# Patient Record
Sex: Female | Born: 1950 | Race: Black or African American | Hispanic: No | Marital: Single | Smoking: Never smoker
Health system: Southern US, Community
[De-identification: ages and names within clinical notes are randomized; demographics above are authoritative.]

## PROBLEM LIST (undated history)

## (undated) DIAGNOSIS — E119 Type 2 diabetes mellitus without complications: Secondary | ICD-10-CM

## (undated) DIAGNOSIS — N289 Disorder of kidney and ureter, unspecified: Secondary | ICD-10-CM

## (undated) DIAGNOSIS — I10 Essential (primary) hypertension: Secondary | ICD-10-CM

## (undated) HISTORY — PX: ABDOMINAL HYSTERECTOMY: SHX81

---

## 2004-12-15 ENCOUNTER — Emergency Department (HOSPITAL_COMMUNITY): Admission: EM | Admit: 2004-12-15 | Discharge: 2004-12-15 | Payer: Self-pay | Admitting: Emergency Medicine

## 2011-09-04 ENCOUNTER — Emergency Department (HOSPITAL_BASED_OUTPATIENT_CLINIC_OR_DEPARTMENT_OTHER): Payer: No Typology Code available for payment source

## 2011-09-04 ENCOUNTER — Emergency Department (HOSPITAL_BASED_OUTPATIENT_CLINIC_OR_DEPARTMENT_OTHER)
Admission: EM | Admit: 2011-09-04 | Discharge: 2011-09-04 | Disposition: A | Discharge status: Home / Self Care | Payer: No Typology Code available for payment source | Attending: Emergency Medicine | Admitting: Emergency Medicine

## 2011-09-04 DIAGNOSIS — M542 Cervicalgia: Secondary | ICD-10-CM | POA: Insufficient documentation

## 2011-09-04 DIAGNOSIS — Y92410 Unspecified street and highway as the place of occurrence of the external cause: Secondary | ICD-10-CM | POA: Insufficient documentation

## 2011-09-04 DIAGNOSIS — M25579 Pain in unspecified ankle and joints of unspecified foot: Secondary | ICD-10-CM | POA: Insufficient documentation

## 2011-09-04 DIAGNOSIS — S8253XA Displaced fracture of medial malleolus of unspecified tibia, initial encounter for closed fracture: Secondary | ICD-10-CM

## 2011-09-04 DIAGNOSIS — S161XXA Strain of muscle, fascia and tendon at neck level, initial encounter: Secondary | ICD-10-CM

## 2011-09-04 DIAGNOSIS — S139XXA Sprain of joints and ligaments of unspecified parts of neck, initial encounter: Secondary | ICD-10-CM | POA: Insufficient documentation

## 2011-09-04 MED ORDER — TRAMADOL HCL 50 MG PO TABS: 50.0000 mg | ORAL_TABLET | Freq: Four times a day (QID) | ORAL | Status: AC | PRN

## 2011-09-04 MED ORDER — TRAMADOL HCL 50 MG PO TABS
50.0000 mg | ORAL_TABLET | Freq: Once | ORAL | Status: AC
  Administered 2011-09-04: 50 mg via ORAL
  Filled 2011-09-04: qty 1
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
## 2011-09-04 NOTE — ED Notes (Signed)
Pt. Also c/o R ankle pain and has noted splint on t he R ankle.

## 2011-09-04 NOTE — ED Notes (Signed)
Pt. Is fully boarded with c-collar and c/o pain in her neck to t he R side.

## 2012-01-17 ENCOUNTER — Encounter (HOSPITAL_BASED_OUTPATIENT_CLINIC_OR_DEPARTMENT_OTHER): Payer: Self-pay | Admitting: *Deleted

## 2012-01-17 ENCOUNTER — Emergency Department (HOSPITAL_BASED_OUTPATIENT_CLINIC_OR_DEPARTMENT_OTHER)
Admission: EM | Admit: 2012-01-17 | Discharge: 2012-01-17 | Disposition: A | Discharge status: Home / Self Care | Payer: BC Managed Care – PPO | Attending: Emergency Medicine | Admitting: Emergency Medicine

## 2012-01-17 ENCOUNTER — Emergency Department (HOSPITAL_BASED_OUTPATIENT_CLINIC_OR_DEPARTMENT_OTHER): Payer: BC Managed Care – PPO

## 2012-01-17 DIAGNOSIS — W010XXA Fall on same level from slipping, tripping and stumbling without subsequent striking against object, initial encounter: Secondary | ICD-10-CM | POA: Insufficient documentation

## 2012-01-17 DIAGNOSIS — Z79899 Other long term (current) drug therapy: Secondary | ICD-10-CM | POA: Insufficient documentation

## 2012-01-17 DIAGNOSIS — S060XAA Concussion with loss of consciousness status unknown, initial encounter: Secondary | ICD-10-CM | POA: Insufficient documentation

## 2012-01-17 DIAGNOSIS — Y9289 Other specified places as the place of occurrence of the external cause: Secondary | ICD-10-CM | POA: Insufficient documentation

## 2012-01-17 DIAGNOSIS — Y9389 Activity, other specified: Secondary | ICD-10-CM | POA: Insufficient documentation

## 2012-01-17 DIAGNOSIS — S060X9A Concussion with loss of consciousness of unspecified duration, initial encounter: Secondary | ICD-10-CM

## 2012-01-17 DIAGNOSIS — I10 Essential (primary) hypertension: Secondary | ICD-10-CM | POA: Insufficient documentation

## 2012-01-17 HISTORY — DX: Essential (primary) hypertension: I10
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
## 2012-01-17 NOTE — ED Notes (Signed)
Patient transported to CT 
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
## 2014-11-15 DIAGNOSIS — E785 Hyperlipidemia, unspecified: Secondary | ICD-10-CM | POA: Insufficient documentation

## 2016-03-12 DIAGNOSIS — Z9119 Patient's noncompliance with other medical treatment and regimen: Secondary | ICD-10-CM | POA: Insufficient documentation

## 2016-03-12 DIAGNOSIS — R7301 Impaired fasting glucose: Secondary | ICD-10-CM | POA: Insufficient documentation

## 2016-03-12 DIAGNOSIS — Z91199 Patient's noncompliance with other medical treatment and regimen due to unspecified reason: Secondary | ICD-10-CM | POA: Insufficient documentation

## 2016-04-07 DIAGNOSIS — Z1239 Encounter for other screening for malignant neoplasm of breast: Secondary | ICD-10-CM | POA: Insufficient documentation

## 2017-02-17 ENCOUNTER — Ambulatory Visit (INDEPENDENT_AMBULATORY_CARE_PROVIDER_SITE_OTHER): Payer: Self-pay | Admitting: Family

## 2017-02-19 ENCOUNTER — Ambulatory Visit (INDEPENDENT_AMBULATORY_CARE_PROVIDER_SITE_OTHER): Payer: Self-pay | Admitting: Physician Assistant

## 2017-02-19 ENCOUNTER — Ambulatory Visit (INDEPENDENT_AMBULATORY_CARE_PROVIDER_SITE_OTHER): Payer: Self-pay | Admitting: Family

## 2017-02-19 ENCOUNTER — Encounter (INDEPENDENT_AMBULATORY_CARE_PROVIDER_SITE_OTHER): Payer: Self-pay

## 2017-06-01 DIAGNOSIS — E079 Disorder of thyroid, unspecified: Secondary | ICD-10-CM | POA: Insufficient documentation

## 2017-06-26 ENCOUNTER — Emergency Department (HOSPITAL_BASED_OUTPATIENT_CLINIC_OR_DEPARTMENT_OTHER): Payer: Medicare HMO

## 2017-06-26 ENCOUNTER — Other Ambulatory Visit: Payer: Self-pay

## 2017-06-26 ENCOUNTER — Encounter (HOSPITAL_BASED_OUTPATIENT_CLINIC_OR_DEPARTMENT_OTHER): Payer: Self-pay | Admitting: Emergency Medicine

## 2017-06-26 ENCOUNTER — Observation Stay (HOSPITAL_BASED_OUTPATIENT_CLINIC_OR_DEPARTMENT_OTHER)
Admission: EM | Admit: 2017-06-26 | Discharge: 2017-06-28 | Disposition: A | Discharge status: Home / Self Care | Payer: Medicare HMO | Attending: Internal Medicine | Admitting: Internal Medicine

## 2017-06-26 DIAGNOSIS — E119 Type 2 diabetes mellitus without complications: Secondary | ICD-10-CM

## 2017-06-26 DIAGNOSIS — Z7984 Long term (current) use of oral hypoglycemic drugs: Secondary | ICD-10-CM | POA: Diagnosis not present

## 2017-06-26 DIAGNOSIS — R55 Syncope and collapse: Secondary | ICD-10-CM | POA: Diagnosis present

## 2017-06-26 DIAGNOSIS — I2699 Other pulmonary embolism without acute cor pulmonale: Principal | ICD-10-CM | POA: Diagnosis present

## 2017-06-26 DIAGNOSIS — R42 Dizziness and giddiness: Secondary | ICD-10-CM | POA: Insufficient documentation

## 2017-06-26 DIAGNOSIS — R059 Cough, unspecified: Secondary | ICD-10-CM

## 2017-06-26 DIAGNOSIS — Z8249 Family history of ischemic heart disease and other diseases of the circulatory system: Secondary | ICD-10-CM | POA: Diagnosis not present

## 2017-06-26 DIAGNOSIS — Z79899 Other long term (current) drug therapy: Secondary | ICD-10-CM | POA: Diagnosis not present

## 2017-06-26 DIAGNOSIS — N289 Disorder of kidney and ureter, unspecified: Secondary | ICD-10-CM

## 2017-06-26 DIAGNOSIS — R05 Cough: Secondary | ICD-10-CM | POA: Insufficient documentation

## 2017-06-26 DIAGNOSIS — N179 Acute kidney failure, unspecified: Secondary | ICD-10-CM | POA: Insufficient documentation

## 2017-06-26 DIAGNOSIS — E876 Hypokalemia: Secondary | ICD-10-CM | POA: Diagnosis not present

## 2017-06-26 DIAGNOSIS — I951 Orthostatic hypotension: Secondary | ICD-10-CM | POA: Diagnosis present

## 2017-06-26 DIAGNOSIS — R0602 Shortness of breath: Secondary | ICD-10-CM | POA: Diagnosis not present

## 2017-06-26 DIAGNOSIS — I10 Essential (primary) hypertension: Secondary | ICD-10-CM | POA: Diagnosis present

## 2017-06-26 HISTORY — DX: Type 2 diabetes mellitus without complications: E11.9

## 2017-06-26 HISTORY — DX: Disorder of kidney and ureter, unspecified: N28.9

## 2017-06-26 LAB — BASIC METABOLIC PANEL
Anion gap: 13 (ref 5–15)
BUN: 15 mg/dL (ref 6–20)
CO2: 31 mmol/L (ref 22–32)
Calcium: 9.1 mg/dL (ref 8.9–10.3)
Chloride: 98 mmol/L — ABNORMAL LOW (ref 101–111)
Creatinine, Ser: 1.58 mg/dL — ABNORMAL HIGH (ref 0.44–1.00)
GFR calc Af Amer: 38 mL/min — ABNORMAL LOW (ref >60)
GFR calc non Af Amer: 33 mL/min — ABNORMAL LOW (ref >60)
Glucose, Bld: 182 mg/dL — ABNORMAL HIGH (ref 65–99)
Potassium: 3.4 mmol/L — ABNORMAL LOW (ref 3.5–5.1)
Sodium: 142 mmol/L (ref 135–145)

## 2017-06-26 LAB — CBC
HCT: 35.9 % — ABNORMAL LOW (ref 36.0–46.0)
Hemoglobin: 12.3 g/dL (ref 12.0–15.0)
MCH: 30.9 pg (ref 26.0–34.0)
MCHC: 34.3 g/dL (ref 30.0–36.0)
MCV: 90.2 fL (ref 78.0–100.0)
Platelets: 294 10*3/uL (ref 150–400)
RBC: 3.98 MIL/uL (ref 3.87–5.11)
RDW: 12.5 % (ref 11.5–15.5)
WBC: 5.1 10*3/uL (ref 4.0–10.5)

## 2017-06-26 LAB — TROPONIN I: Troponin I: <0.03 ng/mL (ref <0.03)

## 2017-06-26 LAB — BRAIN NATRIURETIC PEPTIDE: B Natriuretic Peptide: 37.5 pg/mL (ref 0.0–100.0)

## 2017-06-26 LAB — CREATININE, URINE, RANDOM: Creatinine, Urine: 66.61 mg/dL

## 2017-06-26 LAB — GLUCOSE, CAPILLARY: Glucose-Capillary: 119 mg/dL — ABNORMAL HIGH (ref 65–99)

## 2017-06-26 LAB — D-DIMER, QUANTITATIVE: D-Dimer, Quant: 1.2 ug/mL-FEU — ABNORMAL HIGH (ref 0.00–0.50)

## 2017-06-26 LAB — SODIUM, URINE, RANDOM: Sodium, Ur: 97 mmol/L

## 2017-06-26 MED ORDER — IOPAMIDOL (ISOVUE-370) INJECTION 76%
100.0000 mL | Freq: Once | INTRAVENOUS | Status: AC | PRN
  Administered 2017-06-26: 69 mL via INTRAVENOUS

## 2017-06-26 MED ORDER — POTASSIUM CHLORIDE CRYS ER 20 MEQ PO TBCR
20.0000 meq | EXTENDED_RELEASE_TABLET | Freq: Once | ORAL | Status: AC
  Administered 2017-06-26: 20 meq via ORAL
  Filled 2017-06-26: qty 1

## 2017-06-26 MED ORDER — SIMVASTATIN 20 MG PO TABS
20.0000 mg | ORAL_TABLET | Freq: Every day | ORAL | Status: DC
  Administered 2017-06-27: 20 mg via ORAL
  Filled 2017-06-26: qty 1

## 2017-06-26 MED ORDER — RIVAROXABAN 15 MG PO TABS
15.0000 mg | ORAL_TABLET | Freq: Two times a day (BID) | ORAL | Status: DC
  Administered 2017-06-26 – 2017-06-28 (×4): 15 mg via ORAL
  Filled 2017-06-26 (×4): qty 1

## 2017-06-26 MED ORDER — GUAIFENESIN-DM 100-10 MG/5ML PO SYRP
5.0000 mL | ORAL_SOLUTION | ORAL | Status: DC | PRN
  Administered 2017-06-26 – 2017-06-28 (×5): 5 mL via ORAL
  Filled 2017-06-26 (×5): qty 5

## 2017-06-26 MED ORDER — HYDRALAZINE HCL 20 MG/ML IJ SOLN
10.0000 mg | INTRAMUSCULAR | Status: DC | PRN
  Filled 2017-06-26: qty 1

## 2017-06-26 MED ORDER — ACETAMINOPHEN 650 MG RE SUPP: 650.0000 mg | Freq: Four times a day (QID) | RECTAL | Status: DC | PRN

## 2017-06-26 MED ORDER — SENNOSIDES-DOCUSATE SODIUM 8.6-50 MG PO TABS: 1.0000 | ORAL_TABLET | Freq: Every evening | ORAL | Status: DC | PRN

## 2017-06-26 MED ORDER — SODIUM CHLORIDE 0.9 % IV BOLUS
500.0000 mL | Freq: Once | INTRAVENOUS | Status: AC
  Administered 2017-06-26: 500 mL via INTRAVENOUS

## 2017-06-26 MED ORDER — POTASSIUM CHLORIDE IN NACL 20-0.9 MEQ/L-% IV SOLN
INTRAVENOUS | Status: AC
  Administered 2017-06-26 – 2017-06-27 (×2): via INTRAVENOUS
  Filled 2017-06-26 (×2): qty 1000

## 2017-06-26 MED ORDER — INSULIN ASPART 100 UNIT/ML ~~LOC~~ SOLN
0.0000 [IU] | Freq: Three times a day (TID) | SUBCUTANEOUS | Status: DC
  Administered 2017-06-27 – 2017-06-28 (×2): 1 [IU] via SUBCUTANEOUS

## 2017-06-26 MED ORDER — ONDANSETRON HCL 4 MG PO TABS: 4.0000 mg | ORAL_TABLET | Freq: Four times a day (QID) | ORAL | Status: DC | PRN

## 2017-06-26 MED ORDER — RIVAROXABAN 15 MG PO TABS
15.0000 mg | ORAL_TABLET | Freq: Once | ORAL | Status: AC
  Administered 2017-06-26: 15 mg via ORAL
  Filled 2017-06-26: qty 1

## 2017-06-26 MED ORDER — ASPIRIN 81 MG PO CHEW
324.0000 mg | CHEWABLE_TABLET | Freq: Once | ORAL | Status: AC
  Administered 2017-06-26: 324 mg via ORAL
  Filled 2017-06-26: qty 4

## 2017-06-26 MED ORDER — ACETAMINOPHEN 325 MG PO TABS: 650.0000 mg | ORAL_TABLET | Freq: Four times a day (QID) | ORAL | Status: DC | PRN

## 2017-06-26 MED ORDER — HYDROCODONE-ACETAMINOPHEN 5-325 MG PO TABS: 1.0000 | ORAL_TABLET | ORAL | Status: DC | PRN

## 2017-06-26 MED ORDER — ONDANSETRON HCL 4 MG/2ML IJ SOLN: 4.0000 mg | Freq: Four times a day (QID) | INTRAMUSCULAR | Status: DC | PRN

## 2017-06-26 MED ORDER — INSULIN ASPART 100 UNIT/ML ~~LOC~~ SOLN: 0.0000 [IU] | Freq: Every day | SUBCUTANEOUS | Status: DC

## 2017-06-26 NOTE — ED Provider Notes (Signed)
[facility] EMERGENCY DEPARTMENT Provider Note   CSN: [id number] Arrival date & time: 06/26/17  1228     History   Chief Complaint Chief Complaint  Patient presents with  . Near Syncope    HPI Ashlee Riddle is a 67 y.o. female.  HPI Patient presents to the emergency room after having a near syncopal episode this morning.  Patient states for the last week or so she has had trouble with a cough.  It has increased over the last couple of days.  Patient is frequently coughing and she feels like there is some fluid on her chest.  She has had episodes of posttussive emesis.  She does feel lightheaded when she is coughing.  This morning she had a near syncopal episode where she felt very lightheaded and thought she was going to pass out.  She immediately went to lie down and her symptoms slowly resolved.  She then came to the emergency room for evaluation.  She denies any chest pain.  No leg swelling.  No fevers.  No abdominal pain.  She denies any history of heart or lung disease. Past Medical History:  Diagnosis Date  . Diabetes mellitus without complication (HCC)   . Hypertension     There are no active problems to display for this patient.   Past Surgical History:  Procedure Laterality Date  . ABDOMINAL HYSTERECTOMY       OB History   None      Home Medications    Prior to Admission medications   Medication Sig Start Date End Date Taking? Authorizing Provider  metFORMIN (GLUCOPHAGE) 500 MG tablet Take 500 mg by mouth 2 (two) times daily with a meal.   Yes [provider]  labetalol (NORMODYNE) 200 MG tablet Take 200 mg by mouth 2 (two) times daily.    [provider]  simvastatin (ZOCOR) 20 MG tablet Take 20 mg by mouth every evening.    [provider]  triamterene-hydrochlorothiazide (DYAZIDE) 37.5-25 MG per capsule Take 1 capsule by mouth every morning.    [provider]    Family History History reviewed. No pertinent  family history.  Social History Social History   Tobacco Use  . Smoking status: Never Smoker  . Smokeless tobacco: Never Used  Substance Use Topics  . Alcohol use: No  . Drug use: No     Allergies   Patient has no known allergies.   Review of Systems Review of Systems  All other systems reviewed and are negative.    Physical Exam Updated Vital Signs BP 130/83   Pulse 79   Temp 98.2 F (36.8 C) (Oral)   Resp (!) 21   Ht 1.575 m (5\' 2" )   Wt 82.1 kg (181 lb)   SpO2 99%   BMI 33.11 kg/m   Physical Exam  Constitutional: She appears well-developed and well-nourished. No distress.  HENT:  Head: Normocephalic and atraumatic.  Right Ear: External ear normal.  Left Ear: External ear normal.  Eyes: Conjunctivae are normal. Right eye exhibits no discharge. Left eye exhibits no discharge. No scleral icterus.  Neck: Neck supple. No tracheal deviation present.  Cardiovascular: Normal rate, regular rhythm and intact distal pulses.  Pulmonary/Chest: Effort normal and breath sounds normal. No stridor. No respiratory distress. She has no wheezes. She has no rales.  Abdominal: Soft. Bowel sounds are normal. She exhibits no distension. There is no tenderness. There is no rebound and no guarding.  Musculoskeletal: She exhibits no edema  or tenderness.  Neurological: She is alert. She has normal strength. No cranial nerve deficit (no facial droop, extraocular movements intact, no slurred speech) or sensory deficit. She exhibits normal muscle tone. She displays no seizure activity. Coordination normal.  Skin: Skin is warm and dry. No rash noted.  Psychiatric: She has a normal mood and affect.  Nursing note and vitals reviewed.    ED Treatments / Results  Labs (all labs ordered are listed, but only abnormal results are displayed) Labs Reviewed  BASIC METABOLIC PANEL - Abnormal; Notable for the following components:      Result Value   Potassium 3.4 (*)    Chloride 98 (*)     Glucose, Bld 182 (*)    Creatinine, Ser 1.58 (*)    GFR calc non Af Amer 33 (*)    GFR calc Af Amer 38 (*)    All other components within normal limits  CBC - Abnormal; Notable for the following components:   HCT 35.9 (*)    All other components within normal limits  D-DIMER, QUANTITATIVE (NOT AT [facility]) - Abnormal; Notable for the following components:   D-Dimer, Quant 1.20 (*)    All other components within normal limits  TROPONIN I  BRAIN NATRIURETIC PEPTIDE    EKG EKG Interpretation  Date/Time:  Saturday June 26 2017 12:47:13 EDT Ventricular Rate:  89 PR Interval:    QRS Duration: 90 QT Interval:  390 QTC Calculation: 475 R Axis:   19 Text Interpretation:  Sinus rhythm Low voltage, precordial leads No old tracing to compare Confirmed by Linwood Dibbles [phone]) on 06/26/2017 1:06:37 PM   Radiology Dg Chest 2 View  Result Date: 06/26/2017 CLINICAL DATA:  Cough, shortness breath, and chest discomfort for 2 weeks. EXAM: CHEST - 2 VIEW COMPARISON:  09/04/2011 FINDINGS: The heart size and mediastinal contours are within normal limits. Both lungs are clear. The visualized skeletal structures are unremarkable. IMPRESSION: No active cardiopulmonary disease. Electronically Signed   By: Myles Rosenthal M.D.   On: 06/26/2017 13:38    Procedures Procedures (including critical care time)  Medications Ordered in ED Medications  aspirin chewable tablet 324 mg (324 mg Oral Given 06/26/17 1322)  sodium chloride 0.9 % bolus 500 mL (0 mLs Intravenous Stopped 06/26/17 1440)  iopamidol (ISOVUE-370) 76 % injection 100 mL (69 mLs Intravenous Contrast Given 06/26/17 1440)     Initial Impression / Assessment and Plan / ED Course  I have reviewed the triage vital signs and the nursing notes.  Pertinent labs & imaging results that were available during my care of the patient were reviewed by me and considered in my medical decision making (see chart for details).  Clinical Course as of Jun 27 1515  Sat Jun 26, 2017  1431 D Dimer elevated.  Cr elevated.  Will proceed per radiology protocol to make sure she can get contrast.  CT chest ordered   [JK]    Clinical Course User Index [JK] Linwood Dibbles, MD    Pt presented to the ED with a cough and near syncopal episode.  No complaints of chest pain or shortness of breath.  Labs notable for elevated d dimer.  Mild orthostatic vitals signs.  IV fluids ordered.  Overall, doubt ACS, or cardiac dysrhythmia.  CT angio pending.  If negative suspect uri and vasovagal etiology.  Dr Jacqulyn Bath will follow up on CT scan  Final Clinical Impressions(s) / ED Diagnoses   Final diagnoses:  Near syncope  Cough  ED Discharge Orders    None       Linwood Dibbles, MD 06/26/17 1517

## 2017-06-26 NOTE — Progress Notes (Signed)
ANTICOAGULATION CONSULT NOTE - Initial Consult  Pharmacy Consult for Xarelto Indication: pulmonary embolus  No Known Allergies  Patient Measurements: Height: 5\' 2"  (157.5 cm) Weight: 181 lb (82.1 kg) IBW/kg (Calculated) : 50.1  Vital Signs: Temp: 98.8 F (37.1 C) (06/08 2043) Temp Source: Oral (06/08 1233) BP: 173/91 (06/08 2043) Pulse Rate: 78 (06/08 2043)  Labs: Recent Labs    06/26/17 1319  HGB 12.3  HCT 35.9*  PLT 294  CREATININE 1.58*  TROPONINI <0.03    Estimated Creatinine Clearance: 33.8 mL/min (A) (by C-G formula based on SCr of 1.58 mg/dL (H)).   Medical History: Past Medical History:  Diagnosis Date  . Diabetes mellitus without complication (HCC)   . Hypertension   . Renal insufficiency 06/26/2017    Medications:  Scheduled:  . insulin aspart  0-5 Units Subcutaneous QHS  . [START ON 06/27/2017] insulin aspart  0-9 Units Subcutaneous TID WC  . potassium chloride  20 mEq Oral Once  . Rivaroxaban  15 mg Oral BID WC  . [START ON 06/27/2017] simvastatin  20 mg Oral q1800    Assessment: 67 yof presenting with near syncopal episode. CTA showing acute subsegmental PE bilaterally in lower lobes (no R heart strain). No anticoagulation PTA (of note, [facility]   Hgb 12.3, plt 294. D-dimer 1.2. Scr 1.58 (CrCl using TBW 44 mL/min).   Goal of Therapy:  Monitor platelets by anticoagulation protocol: Yes   Plan:  Order Xarelto 15 mg twice daily for 21 days then 20 mg daily Monitor CBC, renal fx, and for s/sx of bleeding  Girard CooterKimberly Perkins, PharmD Clinical Pharmacist  Pager: [phone] Phone: [phone] 06/26/2017,10:09 PM

## 2017-06-26 NOTE — ED Provider Notes (Signed)
Blood pressure 130/83, pulse 79, temperature 98.2 F (36.8 C), temperature source Oral, resp. rate 18, height 5\' 2"  (1.575 m), weight 82.1 kg (181 lb), SpO2 99 %.  Assuming care from Dr. Lynelle DoctorKnapp.  In short, Ashlee Riddle is a 67 y.o. female with a chief complaint of Near Syncope .  Refer to the original H&P for additional details.  The current plan of care is to follow up CTA and reassess.  03:30 PM Called by radiologist to discuss CT findings.  Patient has a subsegmental PE with no evidence of right heart strain on CT. patient has normal vital signs.  I am concerned about her near syncope events.  I evaluated the patient and discussed the results.  She states that not only did she have an episode of near syncope today but also had one on Wednesday that was not associated with coughing.  She has had swelling in both legs and chronic right knee pain.  No history of gastrointestinal bleeding, ICH, or known clotting disorder.  We discussed the risks and benefits of anticoagulation.  I will start the patient on Xarelto and admit for ECHO given the multiple episodes of near syncope and PE on CT. Patient would [facility].    EKG Interpretation  Date/Time:  Saturday June 26 2017 12:47:13 EDT Ventricular Rate:  89 PR Interval:    QRS Duration: 90 QT Interval:  390 QTC Calculation: 475 R Axis:   19 Text Interpretation:  Sinus rhythm Low voltage, precordial leads No old tracing to compare Confirmed by Linwood DibblesKnapp, Jon [phone]([id number]) on 06/26/2017 1:06:37 PM      04:03 PM [facility] does not have available telemetry beds. [facility]   Discussed patient's case with Hospitalist to request admission. Patient and family (if present) updated with plan. Care transferred to Hospitalist service.  I reviewed all nursing notes, vitals, pertinent old records, EKGs, labs, imaging (as available).  Alona BeneJoshua Long, MD Emergency Medicine      Long, Arlyss RepressJoshua G,  MD 06/26/17 [phone]

## 2017-06-26 NOTE — Discharge Instructions (Signed)
Information on my medicine - XARELTO (rivaroxaban)  This medication education was reviewed with me or my healthcare representative as part of my discharge preparation.   WHY WAS XARELTO PRESCRIBED FOR YOU? Xarelto was prescribed to treat blood clots that may have been found in the veins of your legs (deep vein thrombosis) or in your lungs (pulmonary embolism) and to reduce the risk of them occurring again.  What do you need to know about Xarelto? The starting dose is one 15 mg tablet taken TWICE daily with food for the FIRST 21 DAYS then on 6/29 the dose is changed to one 20 mg tablet taken ONCE A DAY with your evening meal.  DO NOT stop taking Xarelto without talking to the health care provider who prescribed the medication.  Refill your prescription for 20 mg tablets before you run out.  After discharge, you should have regular check-up appointments with your healthcare provider that is prescribing your Xarelto.  In the future your dose may need to be changed if your kidney function changes by a significant amount.  What do you do if you miss a dose? If you are taking Xarelto TWICE DAILY and you miss a dose, take it as soon as you remember. You may take two 15 mg tablets (total 30 mg) at the same time then resume your regularly scheduled 15 mg twice daily the next day.  If you are taking Xarelto ONCE DAILY and you miss a dose, take it as soon as you remember on the same day then continue your regularly scheduled once daily regimen the next day. Do not take two doses of Xarelto at the same time.   Important Safety Information Xarelto is a blood thinner medicine that can cause bleeding. You should call your healthcare provider right away if you experience any of the following: ? Bleeding from an injury or your nose that does not stop. ? Unusual colored urine (red or dark brown) or unusual colored stools (red or black). ? Unusual bruising for unknown reasons. ? A serious fall or if  you hit your head (even if there is no bleeding).  Some medicines may interact with Xarelto and might increase your risk of bleeding while on Xarelto. To help avoid this, consult your healthcare provider or pharmacist prior to using any new prescription or non-prescription medications, including herbals, vitamins, non-steroidal anti-inflammatory drugs (NSAIDs) and supplements.  This website has more information on Xarelto: [email or web address].xarelto.com.

## 2017-06-26 NOTE — ED Triage Notes (Addendum)
Patient states that she has felt lightheaded and dizzy x 2 days - she reports that she has had a cough x 2 days and feels like there is fluid on her chest. Patient states that she almost passed out this am when she coughed so hard
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
## 2017-06-26 NOTE — H&P (Signed)
History and Physical    Ashlee Riddle ZOX:[id number] DOB: [birth year] DOA: 06/26/2017  PCP: Maye Hides, PA   Patient coming from: Home, by way of [facility] ED   Chief Complaint: SOB, near-syncope x2   HPI: Ashlee Riddle is a 67 y.o. female with medical history significant for type 2 diabetes mellitus and hypertension, now presenting to the emergency department for evaluation of lightheadedness upon standing, cough, shortness of breath, and near syncope 3 days ago and again today.  Patient reports that she had been in her usual state of health until the recent insidious development of shortness of breath.  She has an lightheaded upon standing for the past 3 days, had a near syncopal episode 3 days ago, and again this morning.  She reports a nonproductive cough and mild chest discomfort.  She denies fevers, chills, any change in her chronic mild bilateral lower extremity edema, and denies lower extremity tenderness.  She denies any personal or family history of VTE.  She denies any recent long distance travel, had FNA of thyroid recently, but no surgery.  Does not take estrogen or smoke.  ED Course: Upon arrival to the ED, patient is found to be afebrile, saturating adequately on room air, slightly tachypneic, and mildly hypertensive.  EKG features a sinus rhythm and chest x-ray is negative for acute cardiopulmonary disease.  Chemistry panel is notable for slight hyponatremia, glucose 182, and creatinine 1.58, up from 1.04 a year ago.  CBC is unremarkable, troponin is undetectable, and BNP is normal.  D-dimer is elevated to 1.20 and CTA chest reveals acute subsegmental PE in the bilateral lower lobes.  She was found to have orthostatic hypotension in the ED.  She was treated with 324 mg of aspirin, 500 cc normal saline, and Xarelto.  She will be observed on the telemetry unit for ongoing evaluation and management.  Review of Systems:  All other systems reviewed and apart from HPI, are negative.  Past  Medical History:  Diagnosis Date  . Diabetes mellitus without complication (HCC)   . Hypertension   . Renal insufficiency 06/26/2017    Past Surgical History:  Procedure Laterality Date  . ABDOMINAL HYSTERECTOMY       reports that she has never smoked. She has never used smokeless tobacco. She reports that she does not drink alcohol or use drugs.  No Known Allergies  Family History  Problem Relation Age of Onset  . CVA Mother   . CVA Father   . CVA Sister      Prior to Admission medications   Medication Sig Start Date End Date Taking? Authorizing Provider  metFORMIN (GLUCOPHAGE) 500 MG tablet Take 500 mg by mouth 2 (two) times daily with a meal.   Yes [provider]  labetalol (NORMODYNE) 200 MG tablet Take 200 mg by mouth 2 (two) times daily.    [provider]  simvastatin (ZOCOR) 20 MG tablet Take 20 mg by mouth every evening.    [provider]  triamterene-hydrochlorothiazide (DYAZIDE) 37.5-25 MG per capsule Take 1 capsule by mouth every morning.    [provider]    Physical Exam: Vitals:   06/26/17 1830 06/26/17 1900 06/26/17 1930 06/26/17 2043  BP: 140/83 138/84 (!) 149/84 (!) 173/91  Pulse: 74 74 74 78  Resp: 15  11   Temp:    98.8 F (37.1 C)  TempSrc:      SpO2: 97% 99% 98% 100%  Weight:      Height:  Constitutional: NAD, calm  Eyes: PERTLA, lids and conjunctivae normal ENMT: Mucous membranes are moist. Posterior pharynx clear of any exudate or lesions.   Neck: normal, supple, no masses, no thyromegaly Respiratory: clear to auscultation bilaterally, no wheezing, no crackles. Normal respiratory effort.  Cardiovascular: S1 & S2 heard, regular rate and rhythm. Trace pretibial edema bilaterally. No significant JVD. Abdomen: No distension, no tenderness, soft. Bowel sounds normal.  Musculoskeletal: no clubbing / cyanosis. No joint deformity upper and lower extremities.   Skin: no significant rashes, lesions,  ulcers. Warm, dry, well-perfused. Neurologic: CN 2-12 grossly intact. Sensation intact. Strength 5/5 in all 4 limbs.  Psychiatric: Alert and oriented x 3. Pleasant and cooperative.     Labs on Admission: I have personally reviewed following labs and imaging studies  CBC: Recent Labs  Lab 06/26/17 1319  WBC 5.1  HGB 12.3  HCT 35.9*  MCV 90.2  PLT 294   Basic Metabolic Panel: Recent Labs  Lab 06/26/17 1319  NA 142  K 3.4*  CL 98*  CO2 31  GLUCOSE 182*  BUN 15  CREATININE 1.58*  CALCIUM 9.1   GFR: Estimated Creatinine Clearance: 33.8 mL/min (A) (by C-G formula based on SCr of 1.58 mg/dL (H)). Liver Function Tests: No results for input(s): AST, ALT, ALKPHOS, BILITOT, PROT, ALBUMIN in the last 168 hours. No results for input(s): LIPASE, AMYLASE in the last 168 hours. No results for input(s): AMMONIA in the last 168 hours. Coagulation Profile: No results for input(s): INR, PROTIME in the last 168 hours. Cardiac Enzymes: Recent Labs  Lab 06/26/17 1319  TROPONINI <0.03   BNP (last 3 results) No results for input(s): PROBNP in the last 8760 hours. HbA1C: No results for input(s): HGBA1C in the last 72 hours. CBG: No results for input(s): GLUCAP in the last 168 hours. Lipid Profile: No results for input(s): CHOL, HDL, LDLCALC, TRIG, CHOLHDL, LDLDIRECT in the last 72 hours. Thyroid Function Tests: No results for input(s): TSH, T4TOTAL, FREET4, T3FREE, THYROIDAB in the last 72 hours. Anemia Panel: No results for input(s): VITAMINB12, FOLATE, FERRITIN, TIBC, IRON, RETICCTPCT in the last 72 hours. Urine analysis: No results found for: COLORURINE, APPEARANCEUR, LABSPEC, PHURINE, GLUCOSEU, HGBUR, BILIRUBINUR, KETONESUR, PROTEINUR, UROBILINOGEN, NITRITE, LEUKOCYTESUR Sepsis Labs: @LABRCNTIP (procalcitonin:4,lacticidven:4) )No results found for this or any previous visit (from the past 240 hour(s)).   Radiological Exams on Admission: Dg Chest 2 View  Result Date:  06/26/2017 CLINICAL DATA:  Cough, shortness breath, and chest discomfort for 2 weeks. EXAM: CHEST - 2 VIEW COMPARISON:  09/04/2011 FINDINGS: The heart size and mediastinal contours are within normal limits. Both lungs are clear. The visualized skeletal structures are unremarkable. IMPRESSION: No active cardiopulmonary disease. Electronically Signed   By: Myles Rosenthal M.D.   On: 06/26/2017 13:38   Ct Angio Chest Pe W And/or Wo Contrast  Result Date: 06/26/2017 CLINICAL DATA:  Elevated D-dimer. Near syncopal episode today. Cough. EXAM: CT ANGIOGRAPHY CHEST WITH CONTRAST TECHNIQUE: Multidetector CT imaging of the chest was performed using the standard protocol during bolus administration of intravenous contrast. Multiplanar CT image reconstructions and MIPs were obtained to evaluate the vascular anatomy. CONTRAST:  69mL ISOVUE-370 IOPAMIDOL (ISOVUE-370) INJECTION 76% COMPARISON:  Chest radiograph from earlier today. FINDINGS: Cardiovascular: The study is high quality for the evaluation of pulmonary embolism. Acute subsegmental pulmonary emboli in the bilateral lower lobes (series 6/image 149 on the left and image 176 on the right). No saddle pulmonary emboli. No central, lobar or segmental pulmonary emboli. Great vessels are normal in  course and caliber. Main pulmonary artery diameter 2.1 cm. Top-normal heart size. No significant pericardial fluid/thickening. RV/LV ratio 0.88. Mediastinum/Nodes: No discrete thyroid nodules. Unremarkable esophagus. No pathologically enlarged axillary, mediastinal or hilar lymph nodes. Lungs/Pleura: No pneumothorax. No pleural effusion. No acute consolidative airspace disease or lung masses. Subpleural 3 mm solid medial right upper lobe pulmonary nodule (series 5/image 29). No additional significant pulmonary nodules. Upper abdomen: No acute abnormality. Musculoskeletal: No aggressive appearing focal osseous lesions. Mild thoracic spondylosis. Review of the MIP images confirms the above  findings. IMPRESSION: 1. Acute subsegmental pulmonary embolism bilaterally in the lower lobes, low clot burden. No evidence of right heart strain. 2. Solitary 3 mm subpleural medial right upper lobe pulmonary nodule, probably benign. No follow-up needed if patient is low-risk. Non-contrast chest CT can be considered in 12 months if patient is high-risk. This recommendation follows the consensus statement: Guidelines for Management of Incidental Pulmonary Nodules Detected on CT Images:From the Fleischner Society 2017; published online before print (10.1148/[id number]). Critical Value/emergent results were called by telephone at the time of interpretation on 06/26/2017 at 3:38 pm to Dr. Cathi RoanJOSH LONG, who verbally acknowledged these results. Electronically Signed   By: Delbert PhenixJason A Poff M.D.   On: 06/26/2017 15:40    EKG: Independently reviewed. Sinus rhythm.   Assessment/Plan   1. Acute pulmonary embolism  - Presents with near-syncope and SOB, found to elevated d-dimer and CTA chest with acute subsegmental PE in bilateral lower lobes  - Troponin undetectable and hemodynamically stable at rest, but given near-syncope x2, she was referred for admission  - Started on Xarelto in ED, will continue  - Continue cardiac monitoring, check echocardiogram    2. Near syncope; orthostatic hypotension  - Presents with lightheadedness upon standing, SOB, and near-syncope x2 in recent days  - Found to have orthostatic hypotension and acute PE  - Hold diuretics, start IVF hydration, check echocardiogram, repeat orthostatic vitals in am    3. Hypertension  - Hold diuretics in light of new renal insufficiency and orthostatic hypotension  - Use hydralazine IVP's prn for now    4. Type II DM  - A1c was 7.6% one year ago  - Managed with metformin only at home, held on admission  - Check CBG's and use a low-intensity SSI with Novolog while in hospital   5. Renal insufficiency  - SCr is 1.58 on admission, up from  1.04 one year ago  - Uncertain chronicity  - Check urine chemistries, hold triamterene-HCTZ, continue IVF hydration, and repeat chem panel in am     DVT prophylaxis: Xarelto  Code Status: Full  Family Communication: Discussed with patient Consults called: None Admission status: Observation    Briscoe Deutscherimothy S Opyd, MD [facility] Pager [phone]  If 7PM-7AM, please contact night-coverage [email or web address] Password [email or web address]  06/26/2017, 10:07 PM

## 2017-06-26 NOTE — Progress Notes (Signed)
67 year old lady [facility] with sob, chest pain and cough and near syncope. On further work she was found to have sub segmental PE, [facility].    She will be on observation and on telemetry.   Kathlen ModyVijaya Deylan Canterbury, MD [phone]

## 2017-06-27 ENCOUNTER — Observation Stay (HOSPITAL_BASED_OUTPATIENT_CLINIC_OR_DEPARTMENT_OTHER): Payer: Medicare HMO

## 2017-06-27 DIAGNOSIS — I951 Orthostatic hypotension: Secondary | ICD-10-CM | POA: Diagnosis not present

## 2017-06-27 DIAGNOSIS — I10 Essential (primary) hypertension: Secondary | ICD-10-CM | POA: Diagnosis not present

## 2017-06-27 DIAGNOSIS — I2699 Other pulmonary embolism without acute cor pulmonale: Secondary | ICD-10-CM | POA: Diagnosis not present

## 2017-06-27 DIAGNOSIS — R05 Cough: Secondary | ICD-10-CM | POA: Diagnosis not present

## 2017-06-27 DIAGNOSIS — N289 Disorder of kidney and ureter, unspecified: Secondary | ICD-10-CM

## 2017-06-27 DIAGNOSIS — I361 Nonrheumatic tricuspid (valve) insufficiency: Secondary | ICD-10-CM

## 2017-06-27 DIAGNOSIS — E119 Type 2 diabetes mellitus without complications: Secondary | ICD-10-CM | POA: Diagnosis not present

## 2017-06-27 DIAGNOSIS — R55 Syncope and collapse: Secondary | ICD-10-CM

## 2017-06-27 LAB — BASIC METABOLIC PANEL
Anion gap: 11 (ref 5–15)
BUN: 10 mg/dL (ref 6–20)
CO2: 29 mmol/L (ref 22–32)
Calcium: 8.9 mg/dL (ref 8.9–10.3)
Chloride: 102 mmol/L (ref 101–111)
Creatinine, Ser: 1.21 mg/dL — ABNORMAL HIGH (ref 0.44–1.00)
GFR, EST AFRICAN AMERICAN: 52 mL/min — AB (ref >60)
GFR, EST NON AFRICAN AMERICAN: 45 mL/min — AB (ref >60)
Glucose, Bld: 120 mg/dL — ABNORMAL HIGH (ref 65–99)
POTASSIUM: 3.6 mmol/L (ref 3.5–5.1)
SODIUM: 142 mmol/L (ref 135–145)

## 2017-06-27 LAB — CBC
HEMATOCRIT: 35.2 % — AB (ref 36.0–46.0)
Hemoglobin: 11.4 g/dL — ABNORMAL LOW (ref 12.0–15.0)
MCH: 29.8 pg (ref 26.0–34.0)
MCHC: 32.4 g/dL (ref 30.0–36.0)
MCV: 91.9 fL (ref 78.0–100.0)
Platelets: 275 10*3/uL (ref 150–400)
RBC: 3.83 MIL/uL — ABNORMAL LOW (ref 3.87–5.11)
RDW: 12.5 % (ref 11.5–15.5)
WBC: 5.3 10*3/uL (ref 4.0–10.5)

## 2017-06-27 LAB — MAGNESIUM: Magnesium: 2 mg/dL (ref 1.7–2.4)

## 2017-06-27 LAB — GLUCOSE, CAPILLARY
GLUCOSE-CAPILLARY: 120 mg/dL — AB (ref 65–99)
Glucose-Capillary: 124 mg/dL — ABNORMAL HIGH (ref 65–99)
Glucose-Capillary: 117 mg/dL — ABNORMAL HIGH (ref 65–99)
Glucose-Capillary: 142 mg/dL — ABNORMAL HIGH (ref 65–99)

## 2017-06-27 LAB — ECHOCARDIOGRAM COMPLETE
HEIGHTINCHES: 62 in
Weight: 2929.6 oz

## 2017-06-27 LAB — TROPONIN I: Troponin I: <0.03 ng/mL (ref <0.03)

## 2017-06-27 LAB — HIV ANTIBODY (ROUTINE TESTING W REFLEX): HIV Screen 4th Generation wRfx: NONREACTIVE

## 2017-06-27 NOTE — Evaluation (Signed)
Physical Therapy Evaluation Patient Details Name: Ashlee Riddle MRN: [id number] DOB: 09-09-1950 Today's Date: 06/27/2017   History of Present Illness  Patient is a 67 y.o. F with significant PMH of type 2 diabetes mellitus and hypertension, now presenting with evaluation of lightheadedness upon standing, cough, shortness of breath, and near syncope 3 days ago. CTA chest shows acute subsegmental PE in the bilateral lower lobes.   Clinical Impression  Patient evaluated by Physical Therapy with no further acute PT needs identified. All education has been completed and the patient has no further questions. Ambulating community distances with no assistive device without difficulty. No complaint of dizziness and orthostatics stable. SpO2 93% on RA and no dyspnea noted. See below for any follow-up Physical Therapy or equipment needs. PT is signing off. Thank you for this referral.     Follow Up Recommendations No PT follow up    Equipment Recommendations  None recommended by PT    Recommendations for Other Services       Precautions / Restrictions Precautions Precautions: None Restrictions Weight Bearing Restrictions: No      Mobility  Bed Mobility Overal bed mobility: Independent                Transfers Overall transfer level: Independent                  Ambulation/Gait Ambulation/Gait assistance: Independent Ambulation Distance (Feet): 550 Feet Assistive device: None Gait Pattern/deviations: WFL(Within Functional Limits)        Stairs            Wheelchair Mobility    Modified Rankin (Stroke Patients Only)       Balance Overall balance assessment: No apparent balance deficits (not formally assessed)                                           Pertinent Vitals/Pain Pain Assessment: No/denies pain    Home Living Family/patient expects to be discharged to:: Private residence Living Arrangements: Other  relatives(grandson) Available Help at Discharge: Family Type of Home: Apartment Home Access: Stairs to enter Entrance Stairs-Rails: Doctor, general practice of Steps: 12 Home Layout: One level Home Equipment: None      Prior Function Level of Independence: Independent         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        Extremity/Trunk Assessment   Upper Extremity Assessment Upper Extremity Assessment: Overall WFL for tasks assessed    Lower Extremity Assessment Lower Extremity Assessment: Overall WFL for tasks assessed    Cervical / Trunk Assessment Cervical / Trunk Assessment: Normal  Communication   Communication: No difficulties  Cognition Arousal/Alertness: Awake/alert Behavior During Therapy: WFL for tasks assessed/performed Overall Cognitive Status: Within Functional Limits for tasks assessed                                        General Comments      Exercises     Assessment/Plan    PT Assessment Patent does not need any further PT services  PT Problem List         PT Treatment Interventions      PT Goals (Current goals can be found in the Care Plan section)  Acute Rehab PT Goals  Patient Stated Goal: go home with grandson PT Goal Formulation: All assessment and education complete, DC therapy    Frequency     Barriers to discharge        Co-evaluation               AM-PAC PT "6 Clicks" Daily Activity  Outcome Measure Difficulty turning over in bed (including adjusting bedclothes, sheets and blankets)?: None Difficulty moving from lying on back to sitting on the side of the bed? : None Difficulty sitting down on and standing up from a chair with arms (e.g., wheelchair, bedside commode, etc,.)?: None Help needed moving to and from a bed to chair (including a wheelchair)?: None Help needed walking in hospital room?: None Help needed climbing 3-5 steps with a railing? : A Little 6  Click Score: 23    End of Session   Activity Tolerance: Patient tolerated treatment well Patient left: in bed;with call bell/phone within reach;with family/visitor present   PT Visit Diagnosis: Unsteadiness on feet (R26.81);Dizziness and giddiness (R42)    Time: 1338-1400 PT Time Calculation (min) (ACUTE ONLY): 22 min   Charges:   PT Evaluation $PT Eval Low Complexity: 1 Low     PT G Codes:        Laurina Bustlearoline Kathe Wirick, PT, DPT Acute Rehabilitation Services  Pager: [phone]   Vanetta MuldersCarloine H Garlan Drewes 06/27/2017, 2:45 PM

## 2017-06-27 NOTE — Care Management Note (Signed)
Case Management Note  Patient Details  Name: Ashlee ApleyDeloris Riddle MRN: [id number] Date of Birth: 11/04/1950  Subjective/Objective:               Spoke w patient at the bedside, she uses [facility] on [address] [place]. Provided with 30 day Xaralto card. Verbalized understanding for how to use.      Action/Plan:   Expected Discharge Date:  06/29/17               Expected Discharge Plan:  Home/Self Care  In-House Referral:     Discharge planning Services  CM Consult, Medication Assistance  Post Acute Care Choice:    Choice offered to:     DME Arranged:    DME Agency:     HH Arranged:    HH Agency:     Status of Service:  In process, will continue to follow  If discussed at Long Length of Stay Meetings, dates discussed:    Additional Comments:  Lawerance SabalDebbie Lorice Lafave, RN 06/27/2017, 10:13 AM

## 2017-06-27 NOTE — Progress Notes (Signed)
  Echocardiogram 2D Echocardiogram has been performed.  Ashlee Riddle T Ashlee Riddle 06/27/2017, 2:59 PM

## 2017-06-27 NOTE — Progress Notes (Signed)
PROGRESS NOTE    Ashlee Riddle  ZOX:[id number] DOB: 1950-12-13 DOA: 06/26/2017 PCP: Ashlee Riddle    Brief Narrative:  Ashlee Riddle is a 67 y.o. female with medical history significant for type 2 diabetes mellitus and hypertension, now presenting to the emergency department for evaluation of lightheadedness upon standing, cough, shortness of breath, and near syncope 3 days ago and on the day of admission. She was found to have acute PE bilaterally in both lobes.  She was started on xarelto and transferred to telemetry for evaluation of syncope.    Assessment & Plan:   Principal Problem:   Pulmonary embolism (HCC) Active Problems:   Hypertension   Diabetes mellitus without complication (HCC)   Near syncope   Renal insufficiency   Orthostatic hypotension   Pulmonary embolism: Continue with xarelto. Get venous duplex.  Pt reports having a family history of DVT and PE/ in her sister.  Echocardiogram.  And outpatient follow up  With hematology for hypercoagulable work up.     Diabetes mellitus: CBG (last 3)  Recent Labs    06/26/17 2239 06/27/17 0733 06/27/17 1145  GLUCAP 119* 120* 124*   Resume home meds.    orthostatic hypotension:  Repeat orthostatic vital signs.    Hypertension  Well controlled.    AKI:  Gently hydrated and repeat renal parameters improved.    Hypokalemia replaced.   DVT prophylaxis: xarelto Code Status: full code.  Family Communication: none at bedside.  Disposition Plan: pending further evaluation . Clinical improvement.    Consultants:   None.    Procedures: CT angio of  the chest    Antimicrobials: none.    Subjective: Pt reports persistent cough, but breathing improved, dizziness resolved.   Objective: Vitals:   06/26/17 2043 06/26/17 2247 06/26/17 2312 06/27/17 0720  BP: (!) 173/91 (!) 153/95 (!) 144/90 137/75  Pulse: 78  76 74  Resp: 16  16 18   Temp: 98.8 F (37.1 C)  98 F (36.7 C) 98.1 F (36.7 C)    TempSrc: Oral  Oral Oral  SpO2: 100%  100% 96%  Weight: 83.1 kg (183 lb 1.6 oz)     Height: 5\' 2"  (1.575 m)       Intake/Output Summary (Last 24 hours) at 06/27/2017 1213 Last data filed at 06/27/2017 0916 Gross per 24 hour  Intake 1503.67 ml  Output 850 ml  Net 653.67 ml   Filed Weights   06/26/17 1233 06/26/17 2043  Weight: 82.1 kg (181 lb) 83.1 kg (183 lb 1.6 oz)    Examination:  General exam: Appears calm and comfortable  Respiratory system: Clear to auscultation. Respiratory effort normal. Cardiovascular system: S1 & S2 heard, RRR. No JVD, murmurs, rubs, gallops or clicks. No pedal edema. Gastrointestinal system: Abdomen is nondistended, soft and nontender. No organomegaly or masses felt. Normal bowel sounds heard. Central nervous system: Alert and oriented. No focal neurological deficits. Extremities: Symmetric 5 x 5 power. Skin: No rashes, lesions or ulcers Psychiatry: . Mood & affect appropriate.     Data Reviewed: I have personally reviewed following labs and imaging studies  CBC: Recent Labs  Lab 06/26/17 1319 06/27/17 0437  WBC 5.1 5.3  HGB 12.3 11.4*  HCT 35.9* 35.2*  MCV 90.2 91.9  PLT 294 275   Basic Metabolic Panel: Recent Labs  Lab 06/26/17 1319 06/27/17 0437  NA 142 142  K 3.4* 3.6  CL 98* 102  CO2 31 29  GLUCOSE 182* 120*  BUN 15 10  CREATININE 1.58* 1.21*  CALCIUM 9.1 8.9  MG  --  2.0   GFR: Estimated Creatinine Clearance: 44.5 mL/min (A) (by C-G formula based on SCr of 1.21 mg/dL (H)). Liver Function Tests: No results for input(s): AST, ALT, ALKPHOS, BILITOT, PROT, ALBUMIN in the last 168 hours. No results for input(s): LIPASE, AMYLASE in the last 168 hours. No results for input(s): AMMONIA in the last 168 hours. Coagulation Profile: No results for input(s): INR, PROTIME in the last 168 hours. Cardiac Enzymes: Recent Labs  Lab 06/26/17 1319 06/26/17 2255 06/27/17 0437  TROPONINI <0.03 <0.03 <0.03   BNP (last 3 results) No  results for input(s): PROBNP in the last 8760 hours. HbA1C: No results for input(s): HGBA1C in the last 72 hours. CBG: Recent Labs  Lab 06/26/17 2239 06/27/17 0733 06/27/17 1145  GLUCAP 119* 120* 124*   Lipid Profile: No results for input(s): CHOL, HDL, LDLCALC, TRIG, CHOLHDL, LDLDIRECT in the last 72 hours. Thyroid Function Tests: No results for input(s): TSH, T4TOTAL, FREET4, T3FREE, THYROIDAB in the last 72 hours. Anemia Panel: No results for input(s): VITAMINB12, FOLATE, FERRITIN, TIBC, IRON, RETICCTPCT in the last 72 hours. Sepsis Labs: No results for input(s): PROCALCITON, LATICACIDVEN in the last 168 hours.  No results found for this or any previous visit (from the past 240 hour(s)).       Radiology Studies: Dg Chest 2 View  Result Date: 06/26/2017 CLINICAL DATA:  Cough, shortness breath, and chest discomfort for 2 weeks. EXAM: CHEST - 2 VIEW COMPARISON:  09/04/2011 FINDINGS: The heart size and mediastinal contours are within normal limits. Both lungs are clear. The visualized skeletal structures are unremarkable. IMPRESSION: No active cardiopulmonary disease. Electronically Signed   By: Myles Rosenthal M.D.   On: 06/26/2017 13:38   Ct Angio Chest Pe W And/or Wo Contrast  Result Date: 06/26/2017 CLINICAL DATA:  Elevated D-dimer. Near syncopal episode today. Cough. EXAM: CT ANGIOGRAPHY CHEST WITH CONTRAST TECHNIQUE: Multidetector CT imaging of the chest was performed using the standard protocol during bolus administration of intravenous contrast. Multiplanar CT image reconstructions and MIPs were obtained to evaluate the vascular anatomy. CONTRAST:  69mL ISOVUE-370 IOPAMIDOL (ISOVUE-370) INJECTION 76% COMPARISON:  Chest radiograph from earlier today. FINDINGS: Cardiovascular: The study is high quality for the evaluation of pulmonary embolism. Acute subsegmental pulmonary emboli in the bilateral lower lobes (series 6/image 149 on the left and image 176 on the right). No saddle  pulmonary emboli. No central, lobar or segmental pulmonary emboli. Great vessels are normal in course and caliber. Main pulmonary artery diameter 2.1 cm. Top-normal heart size. No significant pericardial fluid/thickening. RV/LV ratio 0.88. Mediastinum/Nodes: No discrete thyroid nodules. Unremarkable esophagus. No pathologically enlarged axillary, mediastinal or hilar lymph nodes. Lungs/Pleura: No pneumothorax. No pleural effusion. No acute consolidative airspace disease or lung masses. Subpleural 3 mm solid medial right upper lobe pulmonary nodule (series 5/image 29). No additional significant pulmonary nodules. Upper abdomen: No acute abnormality. Musculoskeletal: No aggressive appearing focal osseous lesions. Mild thoracic spondylosis. Review of the MIP images confirms the above findings. IMPRESSION: 1. Acute subsegmental pulmonary embolism bilaterally in the lower lobes, low clot burden. No evidence of right heart strain. 2. Solitary 3 mm subpleural medial right upper lobe pulmonary nodule, probably benign. No follow-up needed if patient is low-risk. Non-contrast chest CT can be considered in 12 months if patient is high-risk. This recommendation follows the consensus statement: Guidelines for Management of Incidental Pulmonary Nodules Detected on CT Images:From the Fleischner Society 2017; published online before print (10.1148/radiol.[id number]).  Critical Value/emergent results were called by telephone at the time of interpretation on 06/26/2017 at 3:38 pm to Dr. Cathi Roan, who verbally acknowledged these results. Electronically Signed   By: Delbert Phenix M.D.   On: 06/26/2017 15:40        Scheduled Meds: . insulin aspart  0-5 Units Subcutaneous QHS  . insulin aspart  0-9 Units Subcutaneous TID WC  . Rivaroxaban  15 mg Oral BID WC  . simvastatin  20 mg Oral q1800   Continuous Infusions:   LOS: 0 days    Time spent: 35 minutes.     Kathlen Mody, MD [facility] Pager [phone]   If  7PM-7AM, please contact night-coverage [email or web address] Password [email or web address] 06/27/2017, 12:13 PM

## 2017-06-28 ENCOUNTER — Observation Stay (HOSPITAL_COMMUNITY): Payer: Medicare HMO

## 2017-06-28 DIAGNOSIS — I10 Essential (primary) hypertension: Secondary | ICD-10-CM | POA: Diagnosis not present

## 2017-06-28 DIAGNOSIS — I951 Orthostatic hypotension: Secondary | ICD-10-CM | POA: Diagnosis not present

## 2017-06-28 DIAGNOSIS — I2699 Other pulmonary embolism without acute cor pulmonale: Secondary | ICD-10-CM | POA: Diagnosis not present

## 2017-06-28 DIAGNOSIS — N289 Disorder of kidney and ureter, unspecified: Secondary | ICD-10-CM | POA: Diagnosis not present

## 2017-06-28 DIAGNOSIS — E119 Type 2 diabetes mellitus without complications: Secondary | ICD-10-CM | POA: Diagnosis not present

## 2017-06-28 LAB — BASIC METABOLIC PANEL
Anion gap: 7 (ref 5–15)
BUN: 10 mg/dL (ref 6–20)
CHLORIDE: 107 mmol/L (ref 101–111)
CO2: 29 mmol/L (ref 22–32)
Calcium: 9 mg/dL (ref 8.9–10.3)
Creatinine, Ser: 1.11 mg/dL — ABNORMAL HIGH (ref 0.44–1.00)
GFR calc Af Amer: 58 mL/min — ABNORMAL LOW (ref >60)
GFR calc non Af Amer: 50 mL/min — ABNORMAL LOW (ref >60)
Glucose, Bld: 119 mg/dL — ABNORMAL HIGH (ref 65–99)
Potassium: 3.9 mmol/L (ref 3.5–5.1)
SODIUM: 143 mmol/L (ref 135–145)

## 2017-06-28 LAB — GLUCOSE, CAPILLARY
Glucose-Capillary: 100 mg/dL — ABNORMAL HIGH (ref 65–99)
Glucose-Capillary: 132 mg/dL — ABNORMAL HIGH (ref 65–99)

## 2017-06-28 LAB — UREA NITROGEN, URINE: Urea Nitrogen, Ur: 281 mg/dL

## 2017-06-28 MED ORDER — GUAIFENESIN-DM 100-10 MG/5ML PO SYRP: 5.0000 mL | ORAL_SOLUTION | ORAL | 0 refills | Status: DC | PRN

## 2017-06-28 MED ORDER — RIVAROXABAN 20 MG PO TABS: 20.0000 mg | ORAL_TABLET | Freq: Every day | ORAL | 0 refills | Status: DC

## 2017-06-28 MED ORDER — RIVAROXABAN 15 MG PO TABS: 15.0000 mg | ORAL_TABLET | Freq: Two times a day (BID) | ORAL | 0 refills | Status: DC

## 2017-06-28 NOTE — Discharge Summary (Signed)
Physician Discharge Summary  Ashlee Riddle BJY:[id number] DOB: 02/21/1950 DOA: 06/26/2017  PCP: Ashlee Hides, PA  Admit date: 06/26/2017 Discharge date: 06/28/2017  Admitted From: Home.  Disposition:  Home.   Recommendations for Outpatient Follow-up:  1. Follow up with PCP in 1-2 weeks 2. Please obtain BMP/CBC in one week Please follow up with hematology in 2 weeks as recommended.   Discharge Condition:stable.  CODE STATUS: full code.  Diet recommendation: Heart Healthy   Brief/Interim Summary: Ashlee Cokeris a 67 y.o.femalewith medical history significant fortype 2 diabetes mellitus and hypertension, now presenting to the emergency department for evaluation of lightheadedness upon standing, cough, shortness of breath, and near syncope 3 days ago and on the day of admission. She was found to have acute PE bilaterally in both lobes.  She was started on xarelto and transferred to telemetry for evaluation of syncope.     Discharge Diagnoses:  Principal Problem:   Pulmonary embolism (HCC) Active Problems:   Hypertension   Diabetes mellitus without complication (HCC)   Near syncope   Renal insufficiency   Orthostatic hypotension  Pulmonary embolism: Continue with xarelto.  Pt reports having a family history of DVT and PE/ in her sister.  Echocardiogram reviewed.  And outpatient follow up  With hematology for hypercoagulable work up.     Diabetes mellitus: cbg's well controlled.  Resume home meds.     orthostatic hypotension:  Repeat orthostatic vital signs neg for orthostatic hypotension.    Hypertension  Well controlled. Resume home meds.    AKI:  Gently hydrated and repeat renal parameters improved.    Hypokalemia replaced.       Discharge Instructions  Discharge Instructions    Diet - low sodium heart healthy   Complete by:  As directed    Discharge instructions   Complete by:  As directed    Please follow up with hematology for  pulmonary embolism for hypercoagulable work up.     Allergies as of 06/28/2017   No Known Allergies     Medication List    STOP taking these medications   DYAZIDE 37.5-25 MG capsule Generic drug:  triamterene-hydrochlorothiazide     TAKE these medications   guaiFENesin-dextromethorphan 100-10 MG/5ML syrup Commonly known as:  ROBITUSSIN DM Take 5 mLs by mouth every 4 (four) hours as needed for cough.   labetalol 200 MG tablet Commonly known as:  NORMODYNE Take 200 mg by mouth 2 (two) times daily.   metFORMIN 500 MG tablet Commonly known as:  GLUCOPHAGE Take 500 mg by mouth 2 (two) times daily with a meal.   Rivaroxaban 15 MG Tabs tablet Commonly known as:  XARELTO Take 1 tablet (15 mg total) by mouth 2 (two) times daily with a meal for 19 days.   rivaroxaban 20 MG Tabs tablet Commonly known as:  XARELTO Take 1 tablet (20 mg total) by mouth daily with supper. Start taking on:  07/18/2017   simvastatin 20 MG tablet Commonly known as:  ZOCOR Take 20 mg by mouth every evening.       No Known Allergies  Consultations:  None.    Procedures/Studies: Dg Chest 2 View  Result Date: 06/26/2017 CLINICAL DATA:  Cough, shortness breath, and chest discomfort for 2 weeks. EXAM: CHEST - 2 VIEW COMPARISON:  09/04/2011 FINDINGS: The heart size and mediastinal contours are within normal limits. Both lungs are clear. The visualized skeletal structures are unremarkable. IMPRESSION: No active cardiopulmonary disease. Electronically Signed   By: Alver Sorrow.D.  On: 06/26/2017 13:38   Ct Angio Chest Pe W And/or Wo Contrast  Result Date: 06/26/2017 CLINICAL DATA:  Elevated D-dimer. Near syncopal episode today. Cough. EXAM: CT ANGIOGRAPHY CHEST WITH CONTRAST TECHNIQUE: Multidetector CT imaging of the chest was performed using the standard protocol during bolus administration of intravenous contrast. Multiplanar CT image reconstructions and MIPs were obtained to evaluate the vascular  anatomy. CONTRAST:  69mL ISOVUE-370 IOPAMIDOL (ISOVUE-370) INJECTION 76% COMPARISON:  Chest radiograph from earlier today. FINDINGS: Cardiovascular: The study is high quality for the evaluation of pulmonary embolism. Acute subsegmental pulmonary emboli in the bilateral lower lobes (series 6/image 149 on the left and image 176 on the right). No saddle pulmonary emboli. No central, lobar or segmental pulmonary emboli. Great vessels are normal in course and caliber. Main pulmonary artery diameter 2.1 cm. Top-normal heart size. No significant pericardial fluid/thickening. RV/LV ratio 0.88. Mediastinum/Nodes: No discrete thyroid nodules. Unremarkable esophagus. No pathologically enlarged axillary, mediastinal or hilar lymph nodes. Lungs/Pleura: No pneumothorax. No pleural effusion. No acute consolidative airspace disease or lung masses. Subpleural 3 mm solid medial right upper lobe pulmonary nodule (series 5/image 29). No additional significant pulmonary nodules. Upper abdomen: No acute abnormality. Musculoskeletal: No aggressive appearing focal osseous lesions. Mild thoracic spondylosis. Review of the MIP images confirms the above findings. IMPRESSION: 1. Acute subsegmental pulmonary embolism bilaterally in the lower lobes, low clot burden. No evidence of right heart strain. 2. Solitary 3 mm subpleural medial right upper lobe pulmonary nodule, probably benign. No follow-up needed if patient is low-risk. Non-contrast chest CT can be considered in 12 months if patient is high-risk. This recommendation follows the consensus statement: Guidelines for Management of Incidental Pulmonary Nodules Detected on CT Images:From the Fleischner Society 2017; published online before print (10.1148/[id number]). Critical Value/emergent results were called by telephone at the time of interpretation on 06/26/2017 at 3:38 pm to Dr. Cathi Roan, who verbally acknowledged these results. Electronically Signed   By: Delbert Phenix M.D.   On:  06/26/2017 15:40    Echocardiogram.    Subjective: No new complaints.   Discharge Exam: Vitals:   06/27/17 2140 06/28/17 0824  BP: 137/86 138/76  Pulse: 92 82  Resp:  18  Temp:  97.9 F (36.6 C)  SpO2: 100% 99%   Vitals:   06/27/17 2138 06/27/17 2139 06/27/17 2140 06/28/17 0824  BP: (!) 145/87 (!) 154/86 137/86 138/76  Pulse: 79 83 92 82  Resp: 18   18  Temp: 98.8 F (37.1 C)   97.9 F (36.6 C)  TempSrc: Oral   Oral  SpO2: 98% 100% 100% 99%  Weight:      Height:        General: Pt is alert, awake, not in acute distress Cardiovascular: RRR, S1/S2 +, no rubs, no gallops Respiratory: CTA bilaterally, no wheezing, no rhonchi Abdominal: Soft, NT, ND, bowel sounds + Extremities: no edema, no cyanosis    The results of significant diagnostics from this hospitalization (including imaging, microbiology, ancillary and laboratory) are listed below for reference.     Microbiology: No results found for this or any previous visit (from the past 240 hour(s)).   Labs: BNP (last 3 results) Recent Labs    06/26/17 1319  BNP 37.5   Basic Metabolic Panel: Recent Labs  Lab 06/26/17 1319 06/27/17 0437 06/28/17 0745  NA 142 142 143  K 3.4* 3.6 3.9  CL 98* 102 107  CO2 31 29 29   GLUCOSE 182* 120* 119*  BUN 15 10 10   CREATININE  1.58* 1.21* 1.11*  CALCIUM 9.1 8.9 9.0  MG  --  2.0  --    Liver Function Tests: No results for input(s): AST, ALT, ALKPHOS, BILITOT, PROT, ALBUMIN in the last 168 hours. No results for input(s): LIPASE, AMYLASE in the last 168 hours. No results for input(s): AMMONIA in the last 168 hours. CBC: Recent Labs  Lab 06/26/17 1319 06/27/17 0437  WBC 5.1 5.3  HGB 12.3 11.4*  HCT 35.9* 35.2*  MCV 90.2 91.9  PLT 294 275   Cardiac Enzymes: Recent Labs  Lab 06/26/17 1319 06/26/17 2255 06/27/17 0437  TROPONINI <0.03 <0.03 <0.03   BNP: Invalid input(s): POCBNP CBG: Recent Labs  Lab 06/27/17 1145 06/27/17 1709 06/27/17 2307  06/28/17 0745 06/28/17 1204  GLUCAP 124* 117* 142* 100* 132*   D-Dimer Recent Labs    06/26/17 1319  DDIMER 1.20*   Hgb A1c No results for input(s): HGBA1C in the last 72 hours. Lipid Profile No results for input(s): CHOL, HDL, LDLCALC, TRIG, CHOLHDL, LDLDIRECT in the last 72 hours. Thyroid function studies No results for input(s): TSH, T4TOTAL, T3FREE, THYROIDAB in the last 72 hours.  Invalid input(s): FREET3 Anemia work up No results for input(s): VITAMINB12, FOLATE, FERRITIN, TIBC, IRON, RETICCTPCT in the last 72 hours. Urinalysis No results found for: COLORURINE, APPEARANCEUR, LABSPEC, PHURINE, GLUCOSEU, HGBUR, BILIRUBINUR, KETONESUR, PROTEINUR, UROBILINOGEN, NITRITE, LEUKOCYTESUR Sepsis Labs Invalid input(s): PROCALCITONIN,  WBC,  LACTICIDVEN Microbiology No results found for this or any previous visit (from the past 240 hour(s)).   Time coordinating discharge: 31 minutes  SIGNED:   Kathlen ModyVijaya Shaunika Italiano, MD  [facility] 06/28/2017, 12:51 PM Pager   If 7PM-7AM, please contact night-coverage [email or web address] Password [email or web address]

## 2017-06-28 NOTE — Care Management Note (Signed)
Case Management Note  Patient Details  Name: Ashlee ApleyDeloris Riddle MRN: [id number] Date of Birth: 05-21-1950  Subjective/Objective:                    Action/Plan: CM consulted for a benefits check for Xarelto 15 mg BiD. The cost to the patient is $45/ month. The patient states she can afford this cost. Pt states she has already received the 30 day free coupon.  Family to provide transport home and supervision at home.    Expected Discharge Date:  06/28/17               Expected Discharge Plan:  Home/Self Care  In-House Referral:     Discharge planning Services  CM Consult, Medication Assistance  Post Acute Care Choice:    Choice offered to:     DME Arranged:    DME Agency:     HH Arranged:    HH Agency:     Status of Service:  Completed, signed off  If discussed at [employer] Length of Stay Meetings, dates discussed:    Additional Comments:  Kermit BaloKelli F Jaekwon Mcclune, RN 06/28/2017, 3:01 PM

## 2017-07-15 ENCOUNTER — Other Ambulatory Visit: Payer: Self-pay | Admitting: Family

## 2017-07-15 DIAGNOSIS — I2699 Other pulmonary embolism without acute cor pulmonale: Secondary | ICD-10-CM

## 2017-07-16 ENCOUNTER — Inpatient Hospital Stay: Payer: Medicare HMO | Attending: Hematology & Oncology

## 2017-07-16 ENCOUNTER — Other Ambulatory Visit: Payer: Self-pay

## 2017-07-16 ENCOUNTER — Encounter: Payer: Self-pay | Admitting: Family

## 2017-07-16 ENCOUNTER — Inpatient Hospital Stay (HOSPITAL_BASED_OUTPATIENT_CLINIC_OR_DEPARTMENT_OTHER): Payer: Medicare HMO | Admitting: Family

## 2017-07-16 VITALS — BP 178/88 | HR 83 | Temp 98.3°F | Resp 18 | Wt 186.0 lb

## 2017-07-16 DIAGNOSIS — Z8249 Family history of ischemic heart disease and other diseases of the circulatory system: Secondary | ICD-10-CM | POA: Insufficient documentation

## 2017-07-16 DIAGNOSIS — M7989 Other specified soft tissue disorders: Secondary | ICD-10-CM | POA: Insufficient documentation

## 2017-07-16 DIAGNOSIS — Z800 Family history of malignant neoplasm of digestive organs: Secondary | ICD-10-CM | POA: Insufficient documentation

## 2017-07-16 DIAGNOSIS — Z801 Family history of malignant neoplasm of trachea, bronchus and lung: Secondary | ICD-10-CM

## 2017-07-16 DIAGNOSIS — I129 Hypertensive chronic kidney disease with stage 1 through stage 4 chronic kidney disease, or unspecified chronic kidney disease: Secondary | ICD-10-CM | POA: Diagnosis not present

## 2017-07-16 DIAGNOSIS — Z823 Family history of stroke: Secondary | ICD-10-CM | POA: Insufficient documentation

## 2017-07-16 DIAGNOSIS — I2782 Chronic pulmonary embolism: Secondary | ICD-10-CM

## 2017-07-16 DIAGNOSIS — D688 Other specified coagulation defects: Secondary | ICD-10-CM | POA: Insufficient documentation

## 2017-07-16 DIAGNOSIS — I2601 Septic pulmonary embolism with acute cor pulmonale: Secondary | ICD-10-CM

## 2017-07-16 DIAGNOSIS — Z90710 Acquired absence of both cervix and uterus: Secondary | ICD-10-CM

## 2017-07-16 DIAGNOSIS — R0602 Shortness of breath: Secondary | ICD-10-CM

## 2017-07-16 DIAGNOSIS — I2699 Other pulmonary embolism without acute cor pulmonale: Secondary | ICD-10-CM | POA: Diagnosis present

## 2017-07-16 DIAGNOSIS — R5383 Other fatigue: Secondary | ICD-10-CM | POA: Diagnosis not present

## 2017-07-16 DIAGNOSIS — E119 Type 2 diabetes mellitus without complications: Secondary | ICD-10-CM | POA: Diagnosis not present

## 2017-07-16 DIAGNOSIS — Z7901 Long term (current) use of anticoagulants: Secondary | ICD-10-CM | POA: Insufficient documentation

## 2017-07-16 DIAGNOSIS — N189 Chronic kidney disease, unspecified: Secondary | ICD-10-CM | POA: Insufficient documentation

## 2017-07-16 LAB — CMP (CANCER CENTER ONLY)
ALT: 22 U/L (ref 10–47)
ANION GAP: 11 (ref 5–15)
AST: 25 U/L (ref 11–38)
Albumin: 3.1 g/dL — ABNORMAL LOW (ref 3.5–5.0)
Alkaline Phosphatase: 71 U/L (ref 26–84)
BUN: 12 mg/dL (ref 7–22)
CO2: 30 mmol/L (ref 18–33)
Calcium: 9 mg/dL (ref 8.0–10.3)
Chloride: 104 mmol/L (ref 98–108)
Creatinine: 1.1 mg/dL (ref 0.60–1.20)
GLUCOSE: 163 mg/dL — AB (ref 73–118)
Potassium: 4 mmol/L (ref 3.3–4.7)
Sodium: 145 mmol/L (ref 128–145)
Total Bilirubin: 0.4 mg/dL (ref 0.2–1.6)
Total Protein: 6.7 g/dL (ref 6.4–8.1)

## 2017-07-16 LAB — CBC WITH DIFFERENTIAL (CANCER CENTER ONLY)
BASOS ABS: 0 10*3/uL (ref 0.0–0.1)
Basophils Relative: 0 %
Eosinophils Absolute: 0.1 10*3/uL (ref 0.0–0.5)
Eosinophils Relative: 2 %
HEMATOCRIT: 35 % (ref 34.8–46.6)
HEMOGLOBIN: 11.5 g/dL — AB (ref 11.6–15.9)
LYMPHS PCT: 38 %
Lymphs Abs: 1.3 10*3/uL (ref 0.9–3.3)
MCH: 30.4 pg (ref 26.0–34.0)
MCHC: 32.9 g/dL (ref 32.0–36.0)
MCV: 92.6 fL (ref 81.0–101.0)
Monocytes Absolute: 0.3 10*3/uL (ref 0.1–0.9)
Monocytes Relative: 10 %
NEUTROS ABS: 1.7 10*3/uL (ref 1.5–6.5)
Neutrophils Relative %: 50 %
Platelet Count: 246 10*3/uL (ref 145–400)
RBC: 3.78 MIL/uL (ref 3.70–5.32)
RDW: 13.2 % (ref 11.1–15.7)
WBC: 3.4 10*3/uL — AB (ref 3.9–10.0)

## 2017-07-16 LAB — IRON AND TIBC
IRON: 54 ug/dL (ref 28–170)
Saturation Ratios: 19 % (ref 10.4–31.8)
TIBC: 290 ug/dL (ref 250–450)
UIBC: 236 ug/dL

## 2017-07-16 LAB — FERRITIN: FERRITIN: 86 ng/mL (ref 11–307)

## 2017-07-16 LAB — LACTATE DEHYDROGENASE: LDH: 171 U/L (ref 98–192)

## 2017-07-16 LAB — RETICULOCYTES
RBC.: 3.8 MIL/uL — ABNORMAL LOW (ref 3.87–5.11)
Retic Count, Absolute: 38 10*3/uL (ref 19.0–186.0)
Retic Ct Pct: 1 % (ref 0.4–3.1)

## 2017-07-16 LAB — ANTITHROMBIN III: AntiThromb III Func: 96 % (ref 75–120)

## 2017-07-16 NOTE — Progress Notes (Signed)
Hematology/Oncology Consultation   Name: Ashlee Riddle      MRN: [id number]    Location: Room/bed info not found  Date: 07/16/2017 Time:11:54 AM   REFERRING PHYSICIAN: Hospital referral, Kathlen Mody, MD  REASON FOR CONSULT: Pulmonary emboli   DIAGNOSIS: Bilateral pulmonary emboli, no evidence of heart strain  HISTORY OF PRESENT ILLNESS: Ashlee Riddle is a very pleasant 67 yo African American female with newly diagnosed bilateral pulmonary emboli with no evidence of right heart strain. This was her first thrombotic event.  She had not done any traveling and is not on hormone replacement therapy.  She is tolerating Xarelto well and has had no episodes of bleeding, no bruising or petechiae.  Her cough has improved. She has fatigue and mild SOB when going up stairs and an occasional headache.  She has a strong maternal family history of stroke. Her father passed away from a brain aneurysm.  No personal or familial history of sickle cell disease or trait.  No personal history of cancer. She had a biopsy of her thyroid recently which she states was benign.  Family history of cancer includes: sister - lung (heavy smoker), paternal cousin - unknown primary and paternal aunt - stomach.  She has 4 living children and a son that passed away last year from a heart attack. She has no history of miscarriage. She had a hysterectomy at the age of 42.  No fever, chills, n/v, rash, dizziness, blurred vision, chest pain, palpitations, abdominal pain or changes in bowel or bladder habits.  She has chronic swelling in her lower extremities and is taking her lasix daily as prescribed.  No tenderness, numbness or tingling in her extremities. No c/o pain.  No lymphadenopathy noted on exam.  She has maintained a good appetite and staying well hydrated. Her weight is stable.  She is not a smoker and does not drink alcoholic beverages.  She is retired and used to work as a Neurosurgeon for [employer].  She states  that she is due for her mammogram and will schedule this.  She states that she is also due for her colonoscopy but GI is wanting to hold off until she has been on Xarelto for 6 months.   ROS: All other 10 point review of systems is negative.   PAST MEDICAL HISTORY:   Past Medical History:  Diagnosis Date  . Diabetes mellitus without complication (HCC)   . Hypertension   . Renal insufficiency 06/26/2017    ALLERGIES: No Known Allergies    MEDICATIONS:  Current Outpatient Medications on File Prior to Visit  Medication Sig Dispense Refill  . guaiFENesin-dextromethorphan (ROBITUSSIN DM) 100-10 MG/5ML syrup Take 5 mLs by mouth every 4 (four) hours as needed for cough. 118 mL 0  . labetalol (NORMODYNE) 200 MG tablet Take 200 mg by mouth 2 (two) times daily.    . metFORMIN (GLUCOPHAGE) 500 MG tablet Take 500 mg by mouth 2 (two) times daily with a meal.    . Rivaroxaban (XARELTO) 15 MG TABS tablet Take 1 tablet (15 mg total) by mouth 2 (two) times daily with a meal for 19 days. 38 tablet 0  . [START ON 07/18/2017] rivaroxaban (XARELTO) 20 MG TABS tablet Take 1 tablet (20 mg total) by mouth daily with supper. 30 tablet 0  . simvastatin (ZOCOR) 20 MG tablet Take 20 mg by mouth every evening.     No current facility-administered medications on file prior to visit.      PAST SURGICAL  HISTORY Past Surgical History:  Procedure Laterality Date  . ABDOMINAL HYSTERECTOMY      FAMILY HISTORY: Family History  Problem Relation Age of Onset  . CVA Mother   . CVA Father   . CVA Sister     SOCIAL HISTORY:  reports that she has never smoked. She has never used smokeless tobacco. She reports that she does not drink alcohol or use drugs.  PERFORMANCE STATUS: The patient's performance status is 1 - Symptomatic but completely ambulatory  PHYSICAL EXAM: Most Recent Vital Signs: There were no vitals taken for this visit. BP (!) 178/88 (BP Location: Left Arm, Patient Position: Sitting)   Pulse 83    Temp 98.3 F (36.8 C) (Oral)   Resp 18   Wt 186 lb (84.4 kg)   SpO2 99%   BMI 34.02 kg/m   General Appearance:    Alert, cooperative, no distress, appears stated age  Head:    Normocephalic, without obvious abnormality, atraumatic  Eyes:    PERRL, conjunctiva/corneas clear, EOM's intact, fundi    benign, both eyes        Throat:   Lips, mucosa, and tongue normal; teeth and gums normal  Neck:   Supple, symmetrical, trachea midline, no adenopathy;    thyroid:  no enlargement/tenderness/nodules; no carotid   bruit or JVD  Back:     Symmetric, no curvature, ROM normal, no CVA tenderness  Lungs:     Clear to auscultation bilaterally, respirations unlabored  Chest Wall:    No tenderness or deformity   Heart:    Regular rate and rhythm, S1 and S2 normal, no murmur, rub   or gallop     Abdomen:     Soft, non-tender, bowel sounds active all four quadrants,    no masses, no organomegaly        Extremities:   Extremities normal, atraumatic, no cyanosis or edema  Pulses:   2+ and symmetric all extremities  Skin:   Skin color, texture, turgor normal, no rashes or lesions  Lymph nodes:   Cervical, supraclavicular, and axillary nodes normal  Neurologic:   CNII-XII intact, normal strength, sensation and reflexes    throughout    LABORATORY DATA:  No results found for this or any previous visit (from the past 48 hour(s)).    RADIOGRAPHY: No results found.     PATHOLOGY: None  ASSESSMENT/PLAN: Ashlee Riddle is a very pleasant 67 yo African American female with newly diagnosed bilateral pulmonary emboli with no evidence of right heart strain. This was her first thrombus. She has a strong family history of stroke so we will see what her hypercoagulation panel shows.  She will continue full dose Xarelto for 1 year and then transition to maintenance therapy at 10 mg PO daily.  Once we have her lab results we will set [place] her follow-up and determine when to repeat a CT angio.    All questions  were answered and she is in agreement with the plan. She will contact our office with any questions or concerns. We can certainly see her sooner if needed.  She was discussed with and also seen by Dr. Myna Hidalgo and he is in agreement with the aforementioned.   Emeline Gins      Addendum:  I saw and examined the patient with Maralyn Sago.  I agree with the above assessment.  I doubt that she has any hypercoagulable state.  There really is no family history of thromboembolic disease.  However, there is a family  history of cerebrovascular disease.  Thankfully, she really has no risk factors for thromboembolism.  She does not smoke.  She has not on any postmenopausal estrogens.  We will treat with full dose Xarelto for 1 year.  I think that pulmonary emboli require a longer amount of time for therapy.  I would then put her on maintenance Xarelto of 10 mg for 1 year.  I would check a CT angiogram in 3 months.  We spent about 40 minutes with her.  All the time was spent counseling her.  This is all face-to-face contact.  We answered all of her questions.  We reassured her as much as possible.  We will see her back in another 6 weeks to see how she is doing.  Christin BachPete Ennever, MD

## 2017-07-17 LAB — BETA-2-GLYCOPROTEIN I ABS, IGG/M/A: Beta-2-Glycoprotein I IgA: <9 GPI IgA units (ref 0–25)

## 2017-07-17 LAB — PROTEIN S, TOTAL: Protein S Ag, Total: 104 % (ref 60–150)

## 2017-07-17 LAB — CARDIOLIPIN ANTIBODIES, IGG, IGM, IGA
Anticardiolipin IgA: <9 APL U/mL (ref 0–11)
Anticardiolipin IgG: <9 GPL U/mL (ref 0–14)
Anticardiolipin IgM: <9 MPL U/mL (ref 0–12)

## 2017-07-17 LAB — PROTEIN S ACTIVITY: PROTEIN S ACTIVITY: 110 % (ref 63–140)

## 2017-07-17 LAB — PROTEIN C ACTIVITY: PROTEIN C ACTIVITY: 148 % (ref 73–180)

## 2017-07-18 LAB — DRVVT MIX: dRVVT Mix: 56.4 s — ABNORMAL HIGH (ref 0.0–47.0)

## 2017-07-18 LAB — LUPUS ANTICOAGULANT PANEL
DRVVT: 72.3 s — AB (ref 0.0–47.0)
PTT LA: 35.6 s (ref 0.0–51.9)

## 2017-07-18 LAB — DRVVT CONFIRM: dRVVT Confirm: 1.4 ratio — ABNORMAL HIGH (ref 0.8–1.2)

## 2017-07-19 LAB — HOMOCYSTEINE: Homocysteine: 9.6 umol/L (ref 0.0–15.0)

## 2017-07-20 LAB — PROTEIN C, TOTAL: PROTEIN C, TOTAL: 139 % (ref 60–150)

## 2017-07-20 LAB — PROTHROMBIN GENE MUTATION

## 2017-07-21 LAB — FACTOR 5 LEIDEN

## 2017-07-27 ENCOUNTER — Telehealth: Payer: Self-pay | Admitting: Family

## 2017-07-27 ENCOUNTER — Other Ambulatory Visit: Payer: Self-pay | Admitting: Family

## 2017-07-27 DIAGNOSIS — R760 Raised antibody titer: Secondary | ICD-10-CM

## 2017-07-27 NOTE — Telephone Encounter (Signed)
Voicemail not set up yet, will try again later.

## 2017-07-30 ENCOUNTER — Encounter: Payer: Self-pay | Admitting: Family

## 2017-09-09 ENCOUNTER — Inpatient Hospital Stay: Payer: Medicare HMO | Attending: Hematology & Oncology | Admitting: Family

## 2017-09-09 ENCOUNTER — Inpatient Hospital Stay: Payer: Medicare HMO

## 2017-10-01 ENCOUNTER — Other Ambulatory Visit: Payer: Self-pay | Admitting: *Deleted

## 2017-10-01 MED ORDER — RIVAROXABAN 20 MG PO TABS: 20.0000 mg | ORAL_TABLET | Freq: Every day | ORAL | 6 refills | Status: DC

## 2017-10-04 ENCOUNTER — Telehealth: Payer: Self-pay | Admitting: Hematology & Oncology

## 2017-10-04 NOTE — Telephone Encounter (Signed)
Made several attempts to contact pt to resch appts. Pt phone rings with no answerer and voicemail box is not set up. appt letter mailed 9/16 for appt date/time 10-11-17 at 315 pm

## 2017-10-11 ENCOUNTER — Inpatient Hospital Stay: Payer: Medicare HMO

## 2017-10-11 ENCOUNTER — Inpatient Hospital Stay: Payer: Medicare HMO | Attending: Hematology & Oncology | Admitting: Hematology & Oncology

## 2017-10-20 ENCOUNTER — Inpatient Hospital Stay: Payer: Medicare HMO

## 2017-10-20 ENCOUNTER — Ambulatory Visit: Payer: Medicare HMO | Admitting: Hematology & Oncology

## 2017-11-25 ENCOUNTER — Other Ambulatory Visit: Payer: Medicare HMO

## 2017-11-25 ENCOUNTER — Ambulatory Visit: Payer: Medicare HMO | Admitting: Hematology & Oncology

## 2017-11-29 ENCOUNTER — Telehealth: Payer: Self-pay | Admitting: Hematology & Oncology

## 2017-11-29 NOTE — Telephone Encounter (Signed)
Patient called to r/s her missed appointment.  She stated she did not get a reminder phone call

## 2017-12-01 ENCOUNTER — Telehealth: Payer: Self-pay | Admitting: Hematology & Oncology

## 2017-12-01 ENCOUNTER — Inpatient Hospital Stay: Payer: Medicare HMO

## 2017-12-01 ENCOUNTER — Inpatient Hospital Stay: Payer: Medicare HMO | Admitting: Hematology & Oncology

## 2017-12-01 NOTE — Telephone Encounter (Signed)
Received call form pt to resch appt at 3 pm due to dtr being in an accident. Gave pt new appt date/time 11/27 at 1030 am

## 2017-12-15 ENCOUNTER — Other Ambulatory Visit: Payer: Medicare HMO

## 2017-12-15 ENCOUNTER — Ambulatory Visit: Payer: Medicare HMO | Admitting: Hematology & Oncology

## 2017-12-21 ENCOUNTER — Inpatient Hospital Stay: Payer: Medicare HMO | Admitting: Family

## 2017-12-21 ENCOUNTER — Inpatient Hospital Stay: Payer: Medicare HMO | Attending: Hematology & Oncology

## 2017-12-21 DIAGNOSIS — Z7901 Long term (current) use of anticoagulants: Secondary | ICD-10-CM | POA: Insufficient documentation

## 2017-12-21 DIAGNOSIS — I2699 Other pulmonary embolism without acute cor pulmonale: Secondary | ICD-10-CM | POA: Insufficient documentation

## 2017-12-21 DIAGNOSIS — D6862 Lupus anticoagulant syndrome: Secondary | ICD-10-CM | POA: Insufficient documentation

## 2018-01-03 ENCOUNTER — Other Ambulatory Visit: Payer: Self-pay

## 2018-01-03 ENCOUNTER — Inpatient Hospital Stay (HOSPITAL_BASED_OUTPATIENT_CLINIC_OR_DEPARTMENT_OTHER): Payer: Medicare HMO | Admitting: Family

## 2018-01-03 ENCOUNTER — Inpatient Hospital Stay: Payer: Medicare HMO

## 2018-01-03 VITALS — BP 174/90 | HR 78 | Temp 98.1°F | Resp 20 | Wt 192.8 lb

## 2018-01-03 DIAGNOSIS — D6862 Lupus anticoagulant syndrome: Secondary | ICD-10-CM

## 2018-01-03 DIAGNOSIS — Z7901 Long term (current) use of anticoagulants: Secondary | ICD-10-CM | POA: Diagnosis not present

## 2018-01-03 DIAGNOSIS — R760 Raised antibody titer: Secondary | ICD-10-CM

## 2018-01-03 DIAGNOSIS — I2699 Other pulmonary embolism without acute cor pulmonale: Secondary | ICD-10-CM | POA: Diagnosis not present

## 2018-01-03 DIAGNOSIS — I2782 Chronic pulmonary embolism: Secondary | ICD-10-CM

## 2018-01-03 DIAGNOSIS — I2601 Septic pulmonary embolism with acute cor pulmonale: Secondary | ICD-10-CM

## 2018-01-03 LAB — CBC WITH DIFFERENTIAL (CANCER CENTER ONLY)
Abs Immature Granulocytes: 0.01 10*3/uL (ref 0.00–0.07)
Basophils Absolute: 0 10*3/uL (ref 0.0–0.1)
Basophils Relative: 0 %
Eosinophils Absolute: 0.1 10*3/uL (ref 0.0–0.5)
Eosinophils Relative: 2 %
HCT: 37.5 % (ref 36.0–46.0)
Hemoglobin: 11.9 g/dL — ABNORMAL LOW (ref 12.0–15.0)
Immature Granulocytes: 0 %
Lymphocytes Relative: 58 %
Lymphs Abs: 2.6 10*3/uL (ref 0.7–4.0)
MCH: 29.2 pg (ref 26.0–34.0)
MCHC: 31.7 g/dL (ref 30.0–36.0)
MCV: 92.1 fL (ref 80.0–100.0)
Monocytes Absolute: 0.3 10*3/uL (ref 0.1–1.0)
Monocytes Relative: 7 %
Neutro Abs: 1.5 10*3/uL — ABNORMAL LOW (ref 1.7–7.7)
Neutrophils Relative %: 33 %
Platelet Count: 274 10*3/uL (ref 150–400)
RBC: 4.07 MIL/uL (ref 3.87–5.11)
RDW: 13.3 % (ref 11.5–15.5)
WBC: 4.5 10*3/uL (ref 4.0–10.5)
nRBC: 0 % (ref 0.0–0.2)

## 2018-01-03 LAB — COMPREHENSIVE METABOLIC PANEL
ALT: 18 U/L (ref 0–44)
AST: 27 U/L (ref 15–41)
Albumin: 3.4 g/dL — ABNORMAL LOW (ref 3.5–5.0)
Alkaline Phosphatase: 67 U/L (ref 38–126)
Anion gap: 10 (ref 5–15)
BUN: 14 mg/dL (ref 8–23)
CO2: 25 mmol/L (ref 22–32)
Calcium: 8.8 mg/dL — ABNORMAL LOW (ref 8.9–10.3)
Chloride: 104 mmol/L (ref 98–111)
Creatinine, Ser: 1.18 mg/dL — ABNORMAL HIGH (ref 0.44–1.00)
GFR, EST AFRICAN AMERICAN: 55 mL/min — AB (ref >60)
GFR, EST NON AFRICAN AMERICAN: 47 mL/min — AB (ref >60)
Glucose, Bld: 135 mg/dL — ABNORMAL HIGH (ref 70–99)
Potassium: 3.5 mmol/L (ref 3.5–5.1)
Sodium: 139 mmol/L (ref 135–145)
Total Bilirubin: 0.3 mg/dL (ref 0.3–1.2)
Total Protein: 7.1 g/dL (ref 6.5–8.1)

## 2018-01-03 LAB — D-DIMER, QUANTITATIVE (NOT AT ARMC)

## 2018-01-03 NOTE — Progress Notes (Signed)
Hematology and Oncology Follow Up Visit  Ashlee Riddle [id number] [birth year] 67 y.o. 01/03/2018   Principle Diagnosis:  Bilateral pulmonary emboli, no evidence of heart strain Lupus anticoagulant positive  Current Therapy:   Xarelto 20 mg PO daily for 1 year and then transition to maintenance therapy at 10 mg PO daily in June 202   Interim History:  Ashlee Riddle is here today for follow-up. She missed several appointments and we have not seen her since June. She is doing well but noted some mild SOB with over exertion.  She verbalized that she is taking her Xarelto 20 mg PO daily.  No episodes of bleeding, no bruising or petechiae.  No fever, chills, n/v, cough, rash, dizziness, chest pain, palpitations, abdominal pain or changes in bowel or bladder habits.  No swelling, tenderness, numbness or tingling in her extremities at this time. She takes lasix daily. No c/o pain.  No lymphadenopathy noted on exam.  He has maintained a good appetite and is staying well hydrated. Her weight is stable.   ECOG Performance Status: 1 - Symptomatic but completely ambulatory  Medications:  Allergies as of 01/03/2018   No Known Allergies     Medication List       Accurate as of January 03, 2018 11:44 AM. Always use your most recent med list.        furosemide 20 MG tablet Commonly known as:  LASIX TAKE 1 TABLET BY MOUTH AS NEEDED NEED APPOINTMENT FOR FURTHER REFILLS   guaiFENesin-dextromethorphan 100-10 MG/5ML syrup Commonly known as:  ROBITUSSIN DM Take 5 mLs by mouth every 4 (four) hours as needed for cough.   labetalol 200 MG tablet Commonly known as:  NORMODYNE Take 200 mg by mouth 2 (two) times daily.   metFORMIN 500 MG tablet Commonly known as:  GLUCOPHAGE Take 500 mg by mouth 2 (two) times daily with a meal.   oxyCODONE-acetaminophen 5-325 MG tablet Commonly known as:  PERCOCET/ROXICET Take 1 tablet by mouth 2 (two) times daily as needed.   rivaroxaban 20 MG Tabs  tablet Commonly known as:  XARELTO Take 1 tablet (20 mg total) by mouth daily with supper.   simvastatin 20 MG tablet Commonly known as:  ZOCOR Take 20 mg by mouth every evening.       Allergies: No Known Allergies  Past Medical History, Surgical history, Social history, and Family History were reviewed and updated.  Review of Systems: All other 10 point review of systems is negative.   Physical Exam:  weight is 192 lb 12 oz (87.4 kg). Her oral temperature is 98.1 F (36.7 C). Her blood pressure is 174/90 (abnormal) and her pulse is 78. Her respiration is 20 and oxygen saturation is 98%.   Wt Readings from Last 3 Encounters:  01/03/18 192 lb 12 oz (87.4 kg)  07/16/17 186 lb (84.4 kg)  06/26/17 183 lb 1.6 oz (83.1 kg)    Ocular: Sclerae unicteric, pupils equal, round and reactive to light Ear-nose-throat: Oropharynx clear, dentition fair Lymphatic: No cervical, supraclavicular or axillary adenopathy Lungs no rales or rhonchi, good excursion bilaterally Heart regular rate and rhythm, no murmur appreciated Abd soft, nontender, positive bowel sounds, no liver or spleen tip palpated on exam, no fluid wave  MSK no focal spinal tenderness, no joint edema Neuro: non-focal, well-oriented, appropriate affect Breasts: Deferred   Lab Results  Component Value Date   WBC 4.5 01/03/2018   HGB 11.9 (L) 01/03/2018   HCT 37.5 01/03/2018   MCV 92.1 01/03/2018  PLT 274 01/03/2018   Lab Results  Component Value Date   FERRITIN 86 07/16/2017   IRON 54 07/16/2017   TIBC 290 07/16/2017   UIBC 236 07/16/2017   IRONPCTSAT 19 07/16/2017   Lab Results  Component Value Date   RETICCTPCT 1.0 07/16/2017   RBC 4.07 01/03/2018   No results found for: KPAFRELGTCHN, LAMBDASER, KAPLAMBRATIO No results found for: IGGSERUM, IGA, IGMSERUM No results found for: Dorene ArOTALPROTELP, ALBUMINELP, A1GS, A2GS, Karn PicklerBETS, BETA2SER, GAMS, MSPIKE, SPEI   Chemistry      Component Value Date/Time   NA 139  01/03/2018 1050   K 3.5 01/03/2018 1050   CL 104 01/03/2018 1050   CO2 25 01/03/2018 1050   BUN 14 01/03/2018 1050   CREATININE 1.18 (H) 01/03/2018 1050   CREATININE 1.10 07/16/2017 1133      Component Value Date/Time   CALCIUM 8.8 (L) 01/03/2018 1050   ALKPHOS 67 01/03/2018 1050   AST 27 01/03/2018 1050   AST 25 07/16/2017 1133   ALT 18 01/03/2018 1050   ALT 22 07/16/2017 1133   BILITOT 0.3 01/03/2018 1050   BILITOT 0.4 07/16/2017 1133       Impression and Plan: Ashlee Riddle is a very pleasant 67 yo African American female with history of newly diagnosed bilateral pulmonary emboli with no evidence of right heart strain. She is doing well but still has some occasional SOB with over exertion.  We will repeat a CT angio next week to evaluate her response to Xarelto.  She will continue her same regimen.  We will plan to see her back in another 3 months.  Shew ill contact our office with any questions or concerns. We can certainly see her sooner if need be.   Emeline GinsSarah Fedora Knisely, NP 12/16/201911:44 AM

## 2018-01-04 LAB — LUPUS ANTICOAGULANT PANEL
DRVVT: 35.3 s (ref 0.0–47.0)
PTT Lupus Anticoagulant: 31.7 s (ref 0.0–51.9)

## 2018-04-04 ENCOUNTER — Inpatient Hospital Stay: Payer: Medicare HMO

## 2018-04-04 ENCOUNTER — Inpatient Hospital Stay: Payer: Medicare HMO | Attending: Hematology & Oncology | Admitting: Family

## 2018-10-11 ENCOUNTER — Other Ambulatory Visit: Payer: Self-pay | Admitting: Hematology & Oncology

## 2018-10-19 ENCOUNTER — Encounter: Payer: Self-pay | Admitting: Family

## 2018-10-19 ENCOUNTER — Inpatient Hospital Stay: Payer: Medicare HMO | Attending: Hematology & Oncology

## 2018-10-19 ENCOUNTER — Inpatient Hospital Stay (HOSPITAL_BASED_OUTPATIENT_CLINIC_OR_DEPARTMENT_OTHER): Payer: Medicare HMO | Admitting: Family

## 2018-10-19 ENCOUNTER — Other Ambulatory Visit: Payer: Self-pay

## 2018-10-19 VITALS — BP 185/84 | HR 77 | Temp 98.2°F | Resp 17 | Wt 200.0 lb

## 2018-10-19 DIAGNOSIS — Z7901 Long term (current) use of anticoagulants: Secondary | ICD-10-CM | POA: Insufficient documentation

## 2018-10-19 DIAGNOSIS — I2699 Other pulmonary embolism without acute cor pulmonale: Secondary | ICD-10-CM | POA: Diagnosis present

## 2018-10-19 DIAGNOSIS — I2782 Chronic pulmonary embolism: Secondary | ICD-10-CM

## 2018-10-19 DIAGNOSIS — I2601 Septic pulmonary embolism with acute cor pulmonale: Secondary | ICD-10-CM

## 2018-10-19 DIAGNOSIS — R760 Raised antibody titer: Secondary | ICD-10-CM

## 2018-10-19 LAB — CMP (CANCER CENTER ONLY)
ALT: 15 U/L (ref 0–44)
AST: 16 U/L (ref 15–41)
Albumin: 4.2 g/dL (ref 3.5–5.0)
Alkaline Phosphatase: 68 U/L (ref 38–126)
Anion gap: 7 (ref 5–15)
BUN: 14 mg/dL (ref 8–23)
CO2: 29 mmol/L (ref 22–32)
Calcium: 9.5 mg/dL (ref 8.9–10.3)
Chloride: 106 mmol/L (ref 98–111)
Creatinine: 1.13 mg/dL — ABNORMAL HIGH (ref 0.44–1.00)
GFR, Est AFR Am: 57 mL/min — ABNORMAL LOW (ref >60)
GFR, Estimated: 50 mL/min — ABNORMAL LOW (ref >60)
Glucose, Bld: 139 mg/dL — ABNORMAL HIGH (ref 70–99)
Potassium: 3.8 mmol/L (ref 3.5–5.1)
Sodium: 142 mmol/L (ref 135–145)
Total Bilirubin: 0.4 mg/dL (ref 0.3–1.2)
Total Protein: 7.3 g/dL (ref 6.5–8.1)

## 2018-10-19 LAB — CBC WITH DIFFERENTIAL (CANCER CENTER ONLY)
Abs Immature Granulocytes: 0.01 10*3/uL (ref 0.00–0.07)
Basophils Absolute: 0 10*3/uL (ref 0.0–0.1)
Basophils Relative: 0 %
Eosinophils Absolute: 0.1 10*3/uL (ref 0.0–0.5)
Eosinophils Relative: 2 %
HCT: 38.4 % (ref 36.0–46.0)
Hemoglobin: 12.4 g/dL (ref 12.0–15.0)
Immature Granulocytes: 0 %
Lymphocytes Relative: 55 %
Lymphs Abs: 2.1 10*3/uL (ref 0.7–4.0)
MCH: 29.9 pg (ref 26.0–34.0)
MCHC: 32.3 g/dL (ref 30.0–36.0)
MCV: 92.5 fL (ref 80.0–100.0)
Monocytes Absolute: 0.4 10*3/uL (ref 0.1–1.0)
Monocytes Relative: 10 %
Neutro Abs: 1.2 10*3/uL — ABNORMAL LOW (ref 1.7–7.7)
Neutrophils Relative %: 33 %
Platelet Count: 248 10*3/uL (ref 150–400)
RBC: 4.15 MIL/uL (ref 3.87–5.11)
RDW: 13.4 % (ref 11.5–15.5)
WBC Count: 3.8 10*3/uL — ABNORMAL LOW (ref 4.0–10.5)
nRBC: 0 % (ref 0.0–0.2)

## 2018-10-19 LAB — D-DIMER, QUANTITATIVE: D-Dimer, Quant: <0.27 ug/mL-FEU (ref 0.00–0.50)

## 2018-10-19 MED ORDER — RIVAROXABAN 10 MG PO TABS: ORAL_TABLET | ORAL | 4 refills | Status: DC

## 2018-10-19 NOTE — Progress Notes (Signed)
Hematology and Oncology Follow Up Visit  Ashlee Riddle [id number] 1950-12-17 68 y.o. 10/19/2018   Principle Diagnosis: Bilateral pulmonary emboli, no evidence of heart strain Lupus anticoagulant positive  Current Therapy:   Maintenance Xarelto 10 mg PO daily, started 10/19/2018   Interim History:  Ashlee Riddle is here today for follow-up. She is doing well but has chronic pain in her right knee. She states that she has gained some weight which may be the issue.  She has done well on Xarelto and has not had any issues with bruises or petechiae. She will bleed a little when cut but not in excess. She is able to stop after a few minutes.  No fever, chills, n/v, cough, rash, dizziness, SOB, chest pain, palpitations, abdominal pain or changes in bowel or bladder habits.  No swelling, numbness or tingling in her extremities.  She has maintained a good appetite and is staying well hydrated. Her weight is up 8 lbs since her last visit.   ECOG Performance Status: 1 - Symptomatic but completely ambulatory  Medications:  Allergies as of 10/19/2018   No Known Allergies     Medication List       Accurate as of October 19, 2018 11:28 AM. If you have any questions, ask your nurse or doctor.        furosemide 20 MG tablet Commonly known as: LASIX TAKE 1 TABLET BY MOUTH AS NEEDED NEED APPOINTMENT FOR FURTHER REFILLS   guaiFENesin-dextromethorphan 100-10 MG/5ML syrup Commonly known as: ROBITUSSIN DM Take 5 mLs by mouth every 4 (four) hours as needed for cough.   labetalol 200 MG tablet Commonly known as: NORMODYNE Take 200 mg by mouth 2 (two) times daily.   metFORMIN 500 MG tablet Commonly known as: GLUCOPHAGE Take 500 mg by mouth 2 (two) times daily with a meal.   oxyCODONE-acetaminophen 5-325 MG tablet Commonly known as: PERCOCET/ROXICET Take 1 tablet by mouth 2 (two) times daily as needed.   simvastatin 20 MG tablet Commonly known as: ZOCOR Take 20 mg by mouth every evening.    Xarelto 20 MG Tabs tablet Generic drug: rivaroxaban TAKE 1 TABLET BY MOUTH ONCE DAILY WITH SUPPER       Allergies: No Known Allergies  Past Medical History, Surgical history, Social history, and Family History were reviewed and updated.  Review of Systems: All other 10 point review of systems is negative.   Physical Exam:  vitals were not taken for this visit.   Wt Readings from Last 3 Encounters:  01/03/18 192 lb 12 oz (87.4 kg)  07/16/17 186 lb (84.4 kg)  06/26/17 183 lb 1.6 oz (83.1 kg)    Ocular: Sclerae unicteric, pupils equal, round and reactive to light Ear-nose-throat: Oropharynx clear, dentition fair Lymphatic: No cervical or supraclavicular adenopathy Lungs no rales or rhonchi, good excursion bilaterally Heart regular rate and rhythm, no murmur appreciated Abd soft, nontender, positive bowel sounds, no liver or spleen tip palpated on exam, no fluid wave  MSK no focal spinal tenderness, no joint edema Neuro: non-focal, well-oriented, appropriate affect Breasts: Deferred   Lab Results  Component Value Date   WBC 4.5 01/03/2018   HGB 11.9 (L) 01/03/2018   HCT 37.5 01/03/2018   MCV 92.1 01/03/2018   PLT 274 01/03/2018   Lab Results  Component Value Date   FERRITIN 86 07/16/2017   IRON 54 07/16/2017   TIBC 290 07/16/2017   UIBC 236 07/16/2017   IRONPCTSAT 19 07/16/2017   Lab Results  Component Value Date  RETICCTPCT 1.0 07/16/2017   RBC 4.07 01/03/2018   No results found for: KPAFRELGTCHN, LAMBDASER, KAPLAMBRATIO No results found for: IGGSERUM, IGA, IGMSERUM No results found for: Dorene Ar, A1GS, A2GS, Karn Pickler, SPEI   Chemistry      Component Value Date/Time   NA 139 01/03/2018 1050   K 3.5 01/03/2018 1050   CL 104 01/03/2018 1050   CO2 25 01/03/2018 1050   BUN 14 01/03/2018 1050   CREATININE 1.18 (H) 01/03/2018 1050   CREATININE 1.10 07/16/2017 1133      Component Value Date/Time   CALCIUM 8.8 (L)  01/03/2018 1050   ALKPHOS 67 01/03/2018 1050   AST 27 01/03/2018 1050   AST 25 07/16/2017 1133   ALT 18 01/03/2018 1050   ALT 22 07/16/2017 1133   BILITOT 0.3 01/03/2018 1050   BILITOT 0.4 07/16/2017 1133       Impression and Plan: Ashlee Riddle is a very pleasant 68 yo African American female with history of newly diagnosed bilateral pulmonary emboli with no evidence of right heart strain.  We will get her set up for CT angio to re-evaluate her PE. Imaging was unable to get a hold of her earlier in the year to schedule.  We will now transition her to maintenance dose with Xarelto 10 mg PO daily. She verbalized understanding of the change.  We will plan to see her back in another 6 month.  She will contact our office with any questions or concerns. We can certainly see her sooner if needed.   Emeline Gins, NP 9/30/202011:28 AM

## 2018-10-21 LAB — DRVVT MIX: dRVVT Mix: 63 s — ABNORMAL HIGH (ref 0.0–47.0)

## 2018-10-21 LAB — LUPUS ANTICOAGULANT PANEL
DRVVT: 88.7 s — ABNORMAL HIGH (ref 0.0–47.0)
PTT Lupus Anticoagulant: 33.9 s (ref 0.0–51.9)

## 2018-10-21 LAB — DRVVT CONFIRM: dRVVT Confirm: 1.4 ratio — ABNORMAL HIGH (ref 0.8–1.2)

## 2019-04-05 ENCOUNTER — Other Ambulatory Visit: Payer: Self-pay | Admitting: Hematology & Oncology

## 2019-04-18 ENCOUNTER — Telehealth: Payer: Self-pay | Admitting: Hematology & Oncology

## 2019-04-18 ENCOUNTER — Encounter: Payer: Self-pay | Admitting: Hematology & Oncology

## 2019-04-18 ENCOUNTER — Inpatient Hospital Stay: Payer: Medicare Other | Attending: Hematology & Oncology

## 2019-04-18 ENCOUNTER — Inpatient Hospital Stay (HOSPITAL_BASED_OUTPATIENT_CLINIC_OR_DEPARTMENT_OTHER): Payer: Medicare Other | Admitting: Hematology & Oncology

## 2019-04-18 ENCOUNTER — Other Ambulatory Visit: Payer: Self-pay

## 2019-04-18 VITALS — BP 117/81 | HR 83 | Temp 97.1°F | Resp 20 | Wt 195.4 lb

## 2019-04-18 DIAGNOSIS — M2550 Pain in unspecified joint: Secondary | ICD-10-CM | POA: Insufficient documentation

## 2019-04-18 DIAGNOSIS — R760 Raised antibody titer: Secondary | ICD-10-CM

## 2019-04-18 DIAGNOSIS — I2699 Other pulmonary embolism without acute cor pulmonale: Secondary | ICD-10-CM

## 2019-04-18 DIAGNOSIS — D6862 Lupus anticoagulant syndrome: Secondary | ICD-10-CM | POA: Insufficient documentation

## 2019-04-18 DIAGNOSIS — Z79899 Other long term (current) drug therapy: Secondary | ICD-10-CM | POA: Insufficient documentation

## 2019-04-18 DIAGNOSIS — Z7901 Long term (current) use of anticoagulants: Secondary | ICD-10-CM | POA: Diagnosis not present

## 2019-04-18 LAB — CMP (CANCER CENTER ONLY)
ALT: 14 U/L (ref 0–44)
AST: 16 U/L (ref 15–41)
Albumin: 4.1 g/dL (ref 3.5–5.0)
Alkaline Phosphatase: 66 U/L (ref 38–126)
Anion gap: 10 (ref 5–15)
BUN: 14 mg/dL (ref 8–23)
CO2: 27 mmol/L (ref 22–32)
Calcium: 9.2 mg/dL (ref 8.9–10.3)
Chloride: 105 mmol/L (ref 98–111)
Creatinine: 1.21 mg/dL — ABNORMAL HIGH (ref 0.44–1.00)
GFR, Est AFR Am: 52 mL/min — ABNORMAL LOW (ref >60)
GFR, Estimated: 45 mL/min — ABNORMAL LOW (ref >60)
Glucose, Bld: 172 mg/dL — ABNORMAL HIGH (ref 70–99)
Potassium: 3.4 mmol/L — ABNORMAL LOW (ref 3.5–5.1)
Sodium: 142 mmol/L (ref 135–145)
Total Bilirubin: 0.3 mg/dL (ref 0.3–1.2)
Total Protein: 6.9 g/dL (ref 6.5–8.1)

## 2019-04-18 LAB — CBC WITH DIFFERENTIAL (CANCER CENTER ONLY)
Abs Immature Granulocytes: 0.01 10*3/uL (ref 0.00–0.07)
Basophils Absolute: 0 10*3/uL (ref 0.0–0.1)
Basophils Relative: 1 %
Eosinophils Absolute: 0.1 10*3/uL (ref 0.0–0.5)
Eosinophils Relative: 2 %
HCT: 36.3 % (ref 36.0–46.0)
Hemoglobin: 11.8 g/dL — ABNORMAL LOW (ref 12.0–15.0)
Immature Granulocytes: 0 %
Lymphocytes Relative: 48 %
Lymphs Abs: 2.1 10*3/uL (ref 0.7–4.0)
MCH: 29.9 pg (ref 26.0–34.0)
MCHC: 32.5 g/dL (ref 30.0–36.0)
MCV: 91.9 fL (ref 80.0–100.0)
Monocytes Absolute: 0.4 10*3/uL (ref 0.1–1.0)
Monocytes Relative: 9 %
Neutro Abs: 1.8 10*3/uL (ref 1.7–7.7)
Neutrophils Relative %: 40 %
Platelet Count: 250 10*3/uL (ref 150–400)
RBC: 3.95 MIL/uL (ref 3.87–5.11)
RDW: 12.9 % (ref 11.5–15.5)
WBC Count: 4.4 10*3/uL (ref 4.0–10.5)
nRBC: 0 % (ref 0.0–0.2)

## 2019-04-18 LAB — D-DIMER, QUANTITATIVE: D-Dimer, Quant: <0.27 ug/mL-FEU (ref 0.00–0.50)

## 2019-04-18 NOTE — Telephone Encounter (Signed)
Appointments scheduled calendar printed per 3/30 los.  She didn't give updated insurance info when she checked in so I sent her back up to the front office to give this info to [employer]

## 2019-04-18 NOTE — Progress Notes (Signed)
Hematology and Oncology Follow Up Visit  Ashlee Riddle [id number] [birth year] 69 y.o. 04/18/2019   Principle Diagnosis: Bilateral pulmonary emboli, no evidence of heart strain Lupus anticoagulant positive  Current Therapy:   Maintenance Xarelto 10 mg PO daily, started 10/19/2018 EC ASA 81 mg po q day -- started on 04/18/2019   Interim History:  Ashlee Riddle is here today for follow-up.  She seems to be doing pretty well.  She really has had no specific complaints.  She is on the Xarelto 10 mg daily.  She will finish up 1 year of maintenance Xarelto in August.  Her she was positive for the lupus anticoagulant with her last visit.  I think that she probably needs to be on low-dose aspirin along with the Xarelto.  Once we have her off Xarelto, then we will increase the aspirin to full dose aspirin 325 mg daily.  She was supposed to have a CT angiogram with the last visit.  However, there were some insurance issues and we have not been able to get this.  She has had no cough or shortness of breath.  She has had no chest wall pain.  She has had no bleeding.  She is still working.  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  1 lady is actually 70 years old.  She has had no leg pain or swelling.  She is avoided the coronavirus.  Overall, her performance status is ECOG 1.    Medications:  Allergies as of 04/18/2019   No Known Allergies     Medication List       Accurate as of April 18, 2019 11:23 AM. If you have any questions, ask your nurse or doctor.        furosemide 20 MG tablet Commonly known as: LASIX TAKE 1 TABLET BY MOUTH AS NEEDED NEED APPOINTMENT FOR FURTHER REFILLS   labetalol 200 MG tablet Commonly known as: NORMODYNE Take 200 mg by mouth 2 (two) times daily.   metFORMIN 500 MG tablet Commonly known as: GLUCOPHAGE Take 500 mg by mouth 2 (two) times daily with a meal.   oxyCODONE-acetaminophen 5-325 MG tablet Commonly known as: PERCOCET/ROXICET Take 1  tablet by mouth 2 (two) times daily as needed.   rivaroxaban 10 MG Tabs tablet Commonly known as: Xarelto TAKE 1 TABLET BY MOUTH ONCE DAILY WITH SUPPER What changed: Another medication with the same name was removed. Continue taking this medication, and follow the directions you see here. Changed by: Josph Macho, MD   simvastatin 20 MG tablet Commonly known as: ZOCOR Take 20 mg by mouth every evening.       Allergies: No Known Allergies  Past Medical History, Surgical history, Social history, and Family History were reviewed and updated.  Review of Systems: Review of Systems  Constitutional: Negative.   HENT: Negative.   Eyes: Negative.   Respiratory: Negative.   Cardiovascular: Negative.   Gastrointestinal: Negative.   Genitourinary: Negative.   Musculoskeletal: Positive for joint pain.  Skin: Negative.   Neurological: Negative.   Endo/Heme/Allergies: Negative.   Psychiatric/Behavioral: Negative.      Physical Exam:  weight is 195 lb 6.4 oz (88.6 kg). Her temporal temperature is 97.1 F (36.2 C) (abnormal). Her blood pressure is 117/81 and her pulse is 83. Her respiration is 20 and oxygen saturation is 99%.   Wt Readings from Last 3 Encounters:  04/18/19 195 lb 6.4 oz (88.6 kg)  10/19/18 200 lb (90.7 kg)  01/03/18 192 lb 12  oz (87.4 kg)      Physical Activity:   . Days of Exercise per Week:   . Minutes of Exercise per Session:    Physical Exam Vitals reviewed.  HENT:     Head: Normocephalic and atraumatic.  Eyes:     Pupils: Pupils are equal, round, and reactive to light.  Cardiovascular:     Rate and Rhythm: Normal rate and regular rhythm.     Heart sounds: Normal heart sounds.  Pulmonary:     Effort: Pulmonary effort is normal.     Breath sounds: Normal breath sounds.  Abdominal:     General: Bowel sounds are normal.     Palpations: Abdomen is soft.  Musculoskeletal:        General: No tenderness or deformity. Normal range of motion.      Cervical back: Normal range of motion.  Lymphadenopathy:     Cervical: No cervical adenopathy.  Skin:    General: Skin is warm and dry.     Findings: No erythema or rash.  Neurological:     Mental Status: She is alert and oriented to person, place, and time.  Psychiatric:        Behavior: Behavior normal.        Thought Content: Thought content normal.        Judgment: Judgment normal.      Lab Results  Component Value Date   WBC 4.4 04/18/2019   HGB 11.8 (L) 04/18/2019   HCT 36.3 04/18/2019   MCV 91.9 04/18/2019   PLT 250 04/18/2019   Lab Results  Component Value Date   FERRITIN 86 07/16/2017   IRON 54 07/16/2017   TIBC 290 07/16/2017   UIBC 236 07/16/2017   IRONPCTSAT 19 07/16/2017   Lab Results  Component Value Date   RETICCTPCT 1.0 07/16/2017   RBC 3.95 04/18/2019   No results found for: KPAFRELGTCHN, LAMBDASER, KAPLAMBRATIO No results found for: IGGSERUM, IGA, IGMSERUM No results found for: Odetta Pink, SPEI   Chemistry      Component Value Date/Time   NA 142 04/18/2019 1043   K 3.4 (L) 04/18/2019 1043   CL 105 04/18/2019 1043   CO2 27 04/18/2019 1043   BUN 14 04/18/2019 1043   CREATININE 1.21 (H) 04/18/2019 1043      Component Value Date/Time   CALCIUM 9.2 04/18/2019 1043   ALKPHOS 66 04/18/2019 1043   AST 16 04/18/2019 1043   ALT 14 04/18/2019 1043   BILITOT 0.3 04/18/2019 1043       Impression and Plan: Ashlee Riddle is a very pleasant 69 yo African American female with history of bilateral pulmonary emboli with no evidence of right heart strain.   Her hypercoagulable studies have all come back okay although she has had the lupus anticoagulant on more than one occasion.  She is on Xarelto.  She will complete her maintenance Xarelto when we see her back in August.  At that time, we will then switch her over to full dose aspirin.  It would be nice to get a follow-up CT angiogram of the chest  just that we can monitor the pulmonary emboli.  She does need anything for her knees with respect to intervention surgically, I do not see a problem with this.   Ashlee Napoleon, MD 3/30/202111:23 AM

## 2019-04-20 LAB — DRVVT MIX: dRVVT Mix: 60 s — ABNORMAL HIGH (ref 0.0–40.4)

## 2019-04-20 LAB — LUPUS ANTICOAGULANT PANEL
DRVVT: 74.2 s — ABNORMAL HIGH (ref 0.0–47.0)
PTT Lupus Anticoagulant: 35.1 s (ref 0.0–51.9)

## 2019-04-20 LAB — DRVVT CONFIRM: dRVVT Confirm: 1.4 ratio — ABNORMAL HIGH (ref 0.8–1.2)

## 2019-05-07 IMAGING — CR DG CHEST 2V
2 series · 2 of 2 positions shown · non-contrast
Comparison: 09/04/2011

CLINICAL DATA: Cough, shortness breath, and chest discomfort for 2
weeks.

EXAM:
CHEST - 2 VIEW

[w chest pa]
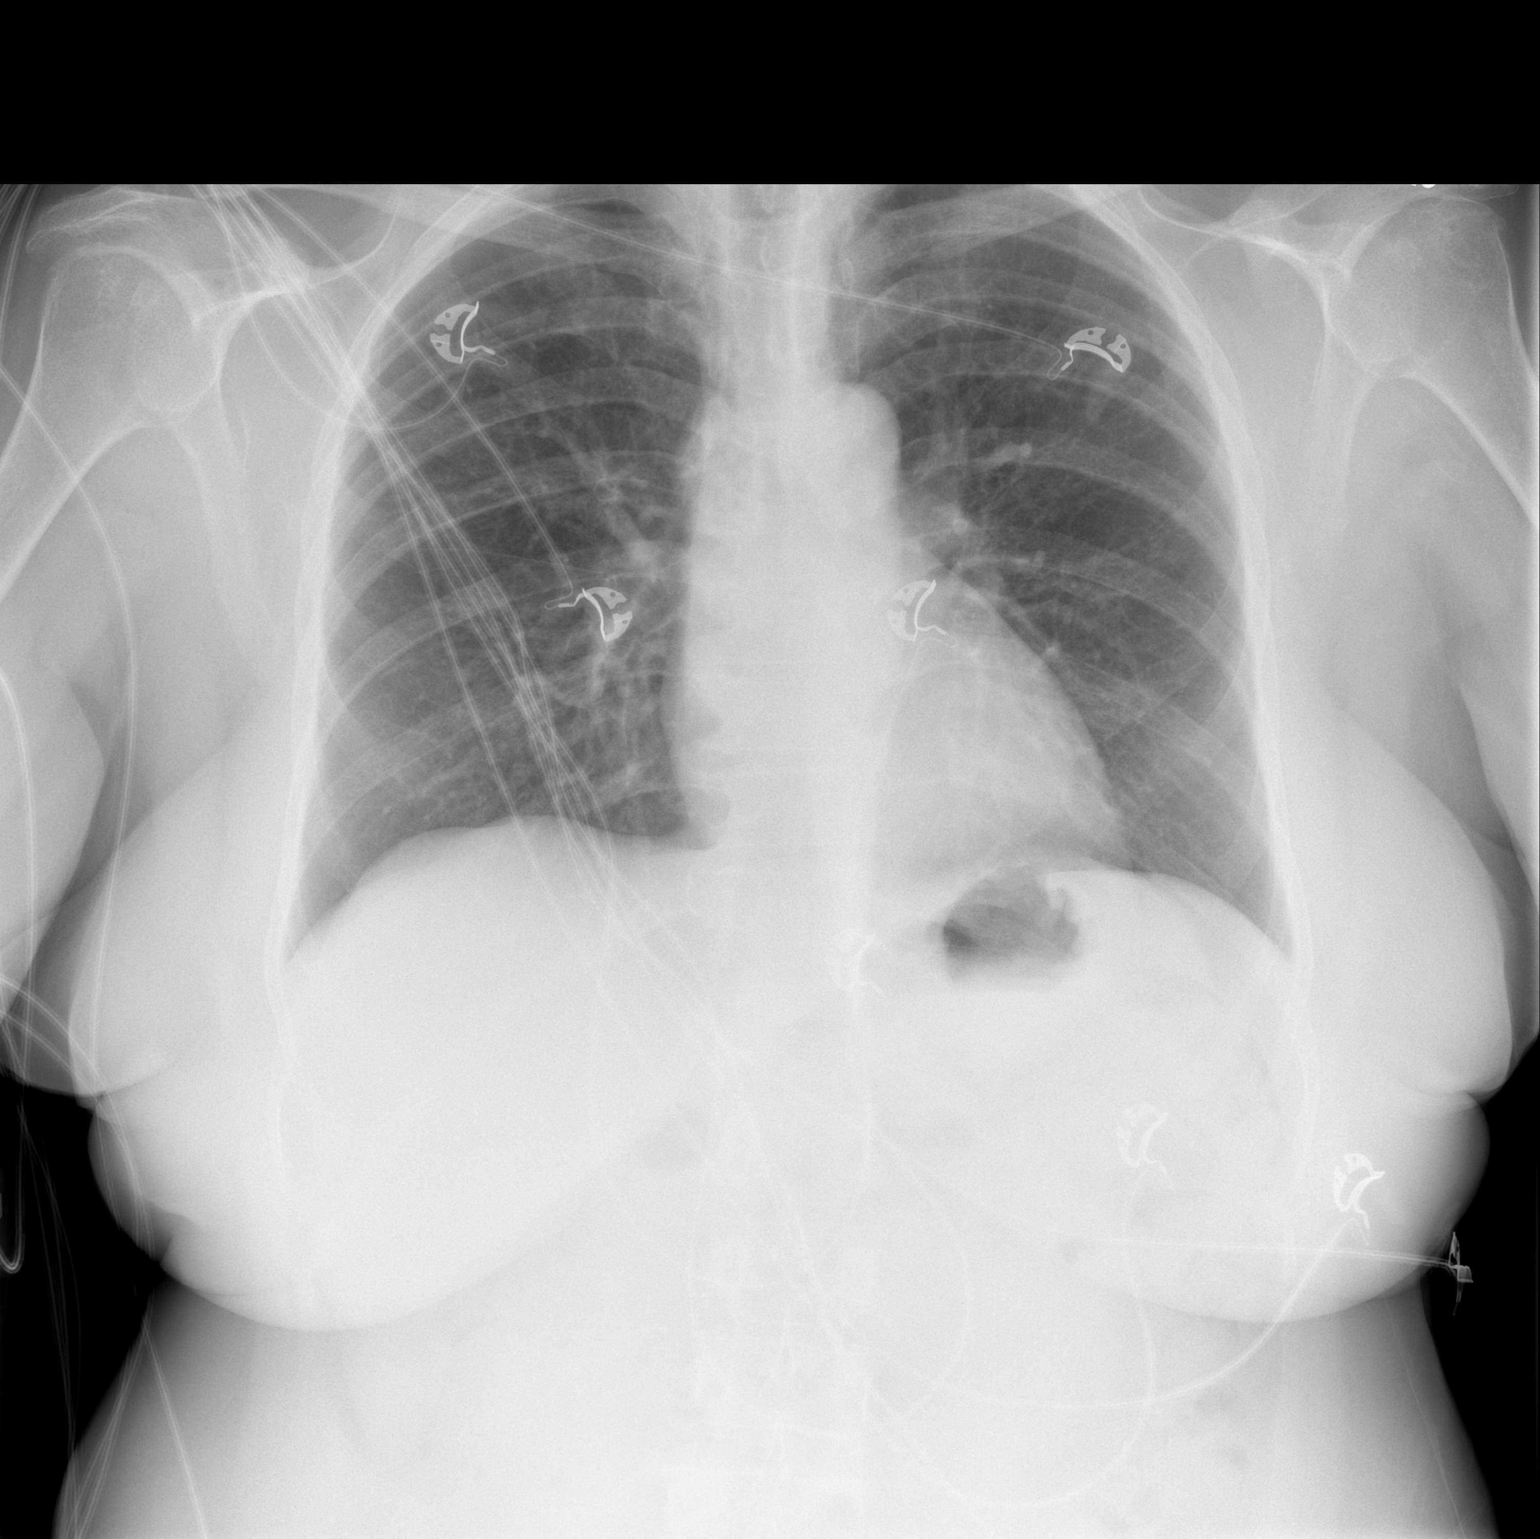

[w chest lat]
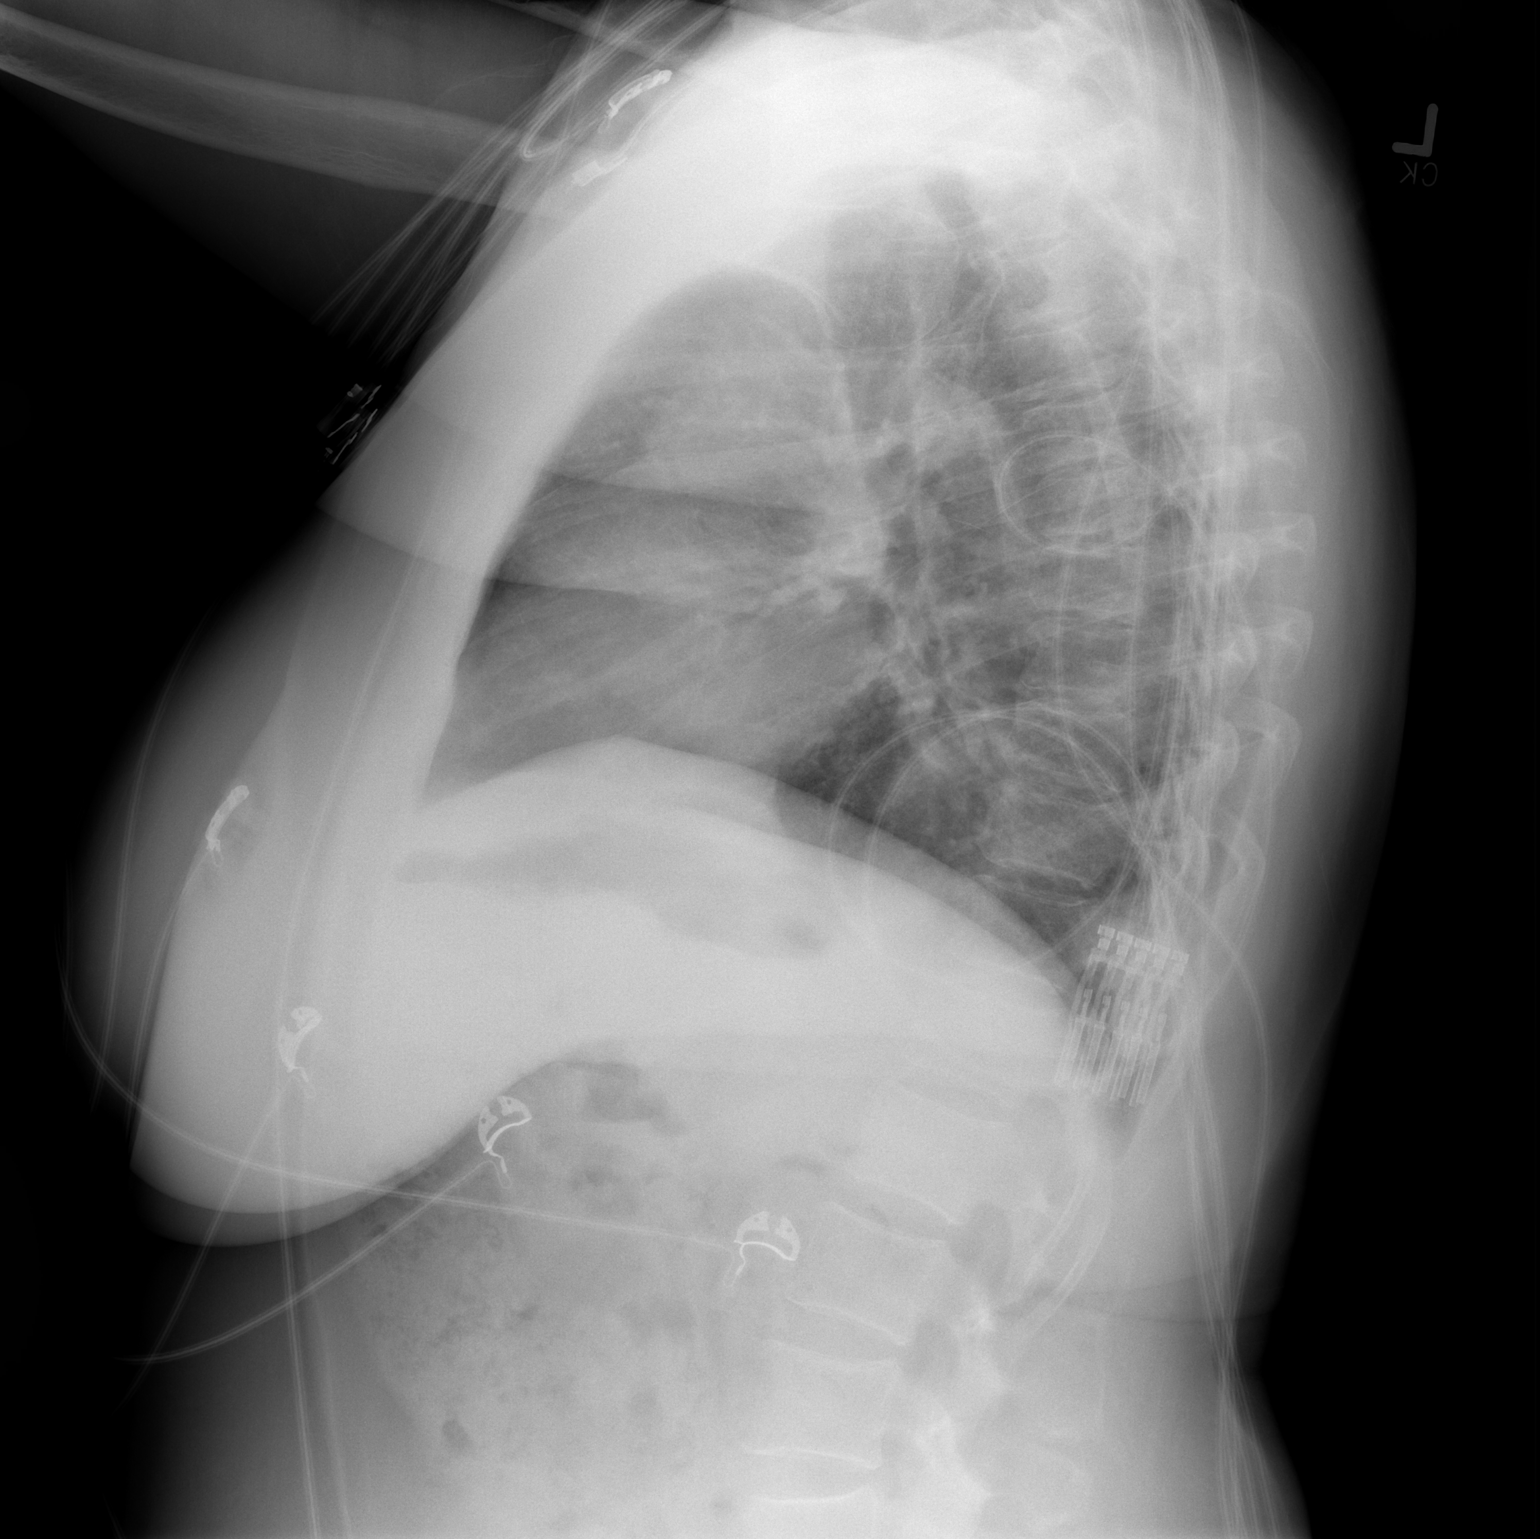

[2 of 2 positions shown; findings below may reference images not displayed]

FINDINGS: The heart size and mediastinal contours are within normal limits.
Both lungs are clear. The visualized skeletal structures are
unremarkable.
IMPRESSION: No active cardiopulmonary disease.

## 2019-05-07 IMAGING — CT CT ANGIO CHEST
2 of 8 series · 18 of 36 positions shown · IV contrast (iopamidol)
Comparison: Chest radiograph from earlier today.

CLINICAL DATA: Elevated D-dimer. Near syncopal episode today.
Cough.

EXAM:
CT ANGIOGRAPHY CHEST WITH CONTRAST
TECHNIQUE: Multidetector CT imaging of the chest was performed using the
standard protocol during bolus administration of intravenous
contrast. Multiplanar CT image reconstructions and MIPs were
obtained to evaluate the vascular anatomy.
CONTRAST:  69mL [id number] IOPAMIDOL ([id number]) INJECTION 76%

[Series 6: pe thins · axial · 0.73mm/px · z∈[-275,-48]mm · 17 of 255 slices shown]
[im 14/255  lung]
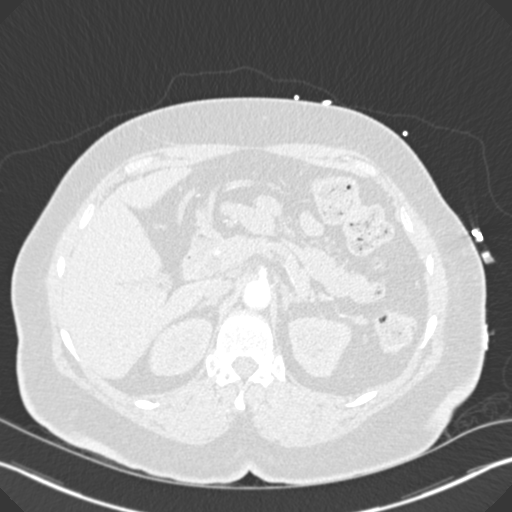
[im 27/255  mediastinal]
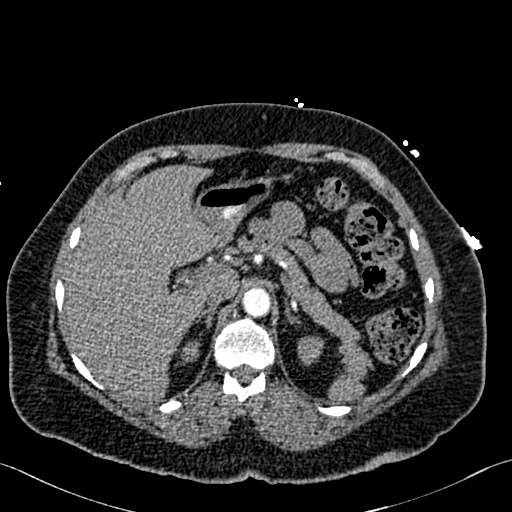
[im 41/255  lung]
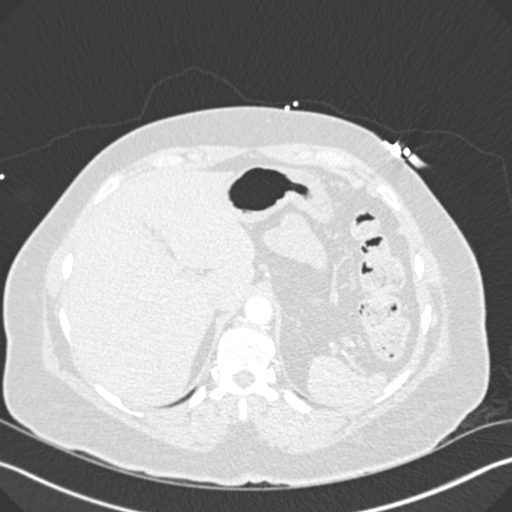
[im 54/255  mediastinal]
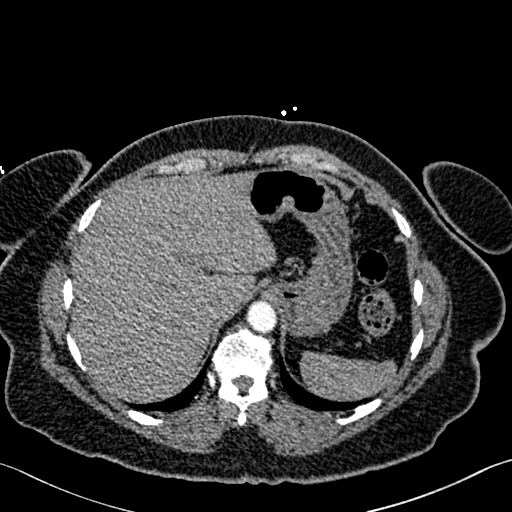
[im 67/255  lung]
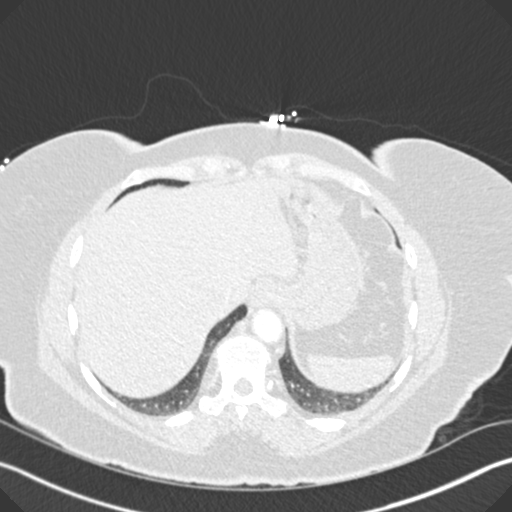
[im 81/255  mediastinal]
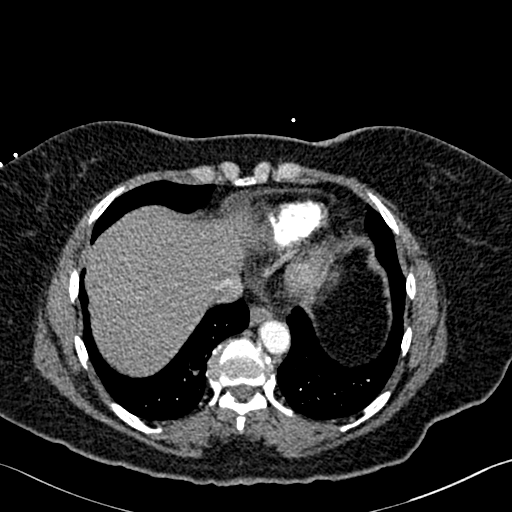
[im 94/255  lung]
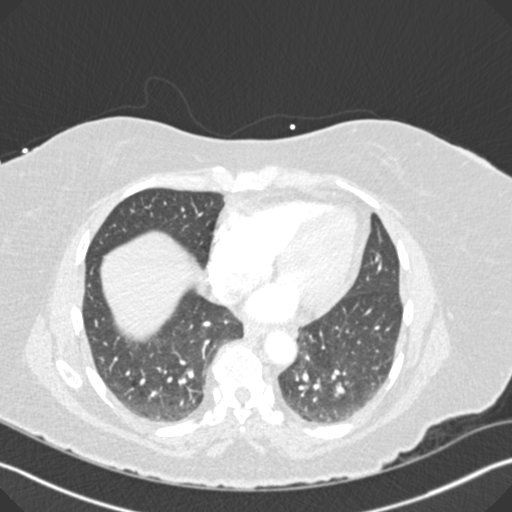
[im 107/255  mediastinal]
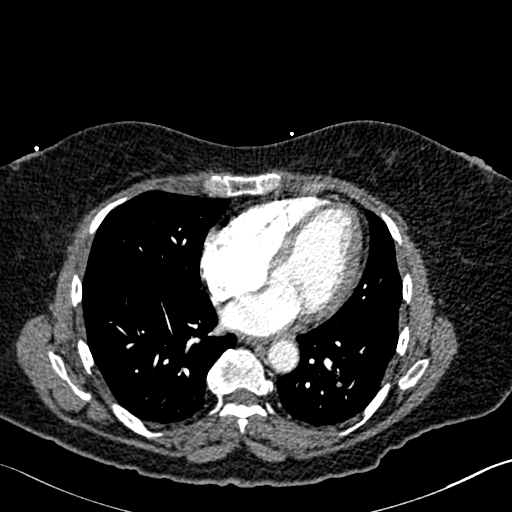
[im 134/255  lung]
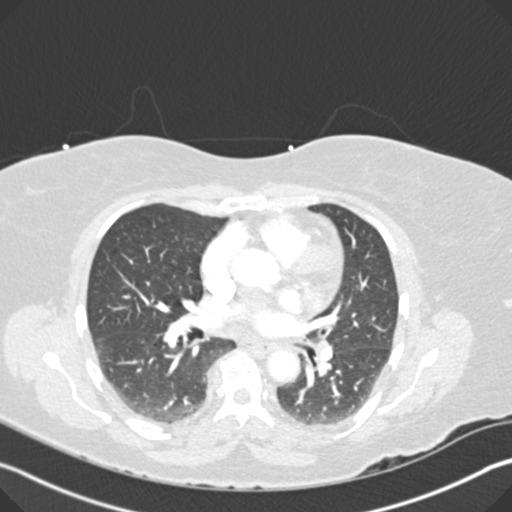
[im 148/255  mediastinal]
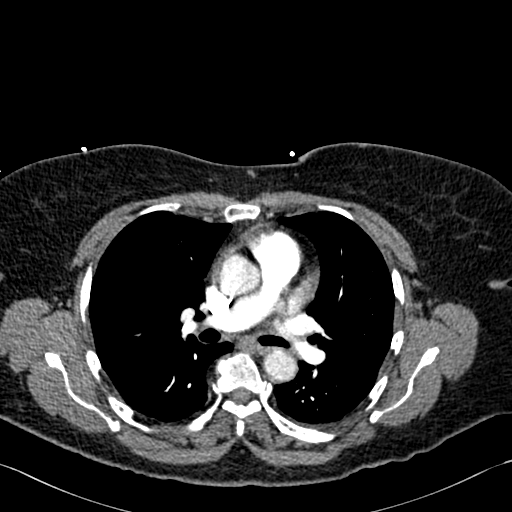
[im 161/255  lung]
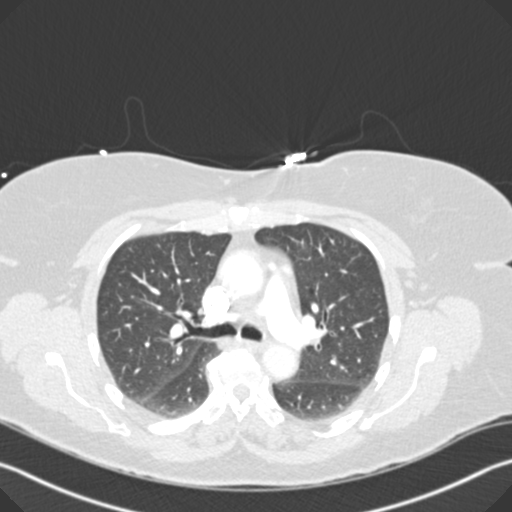
[im 174/255  mediastinal]
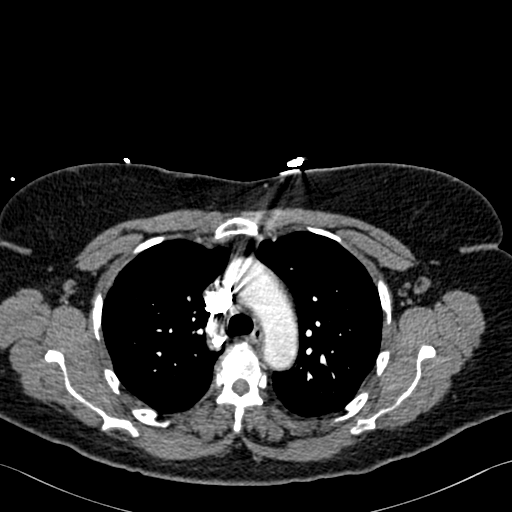
[im 188/255  lung]
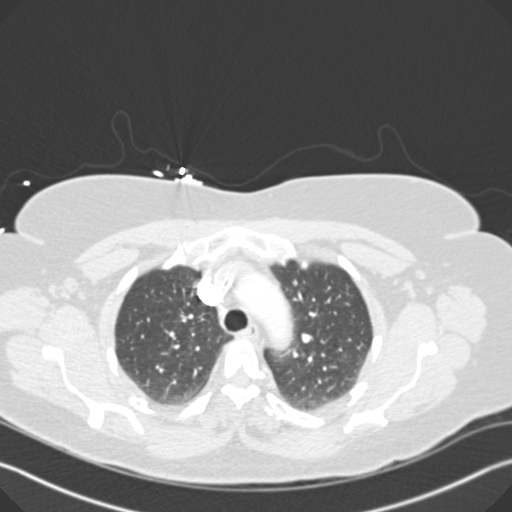
[im 201/255  mediastinal]
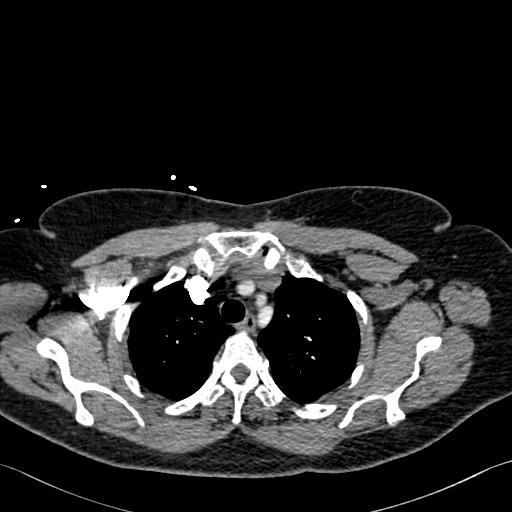
[im 214/255  lung]
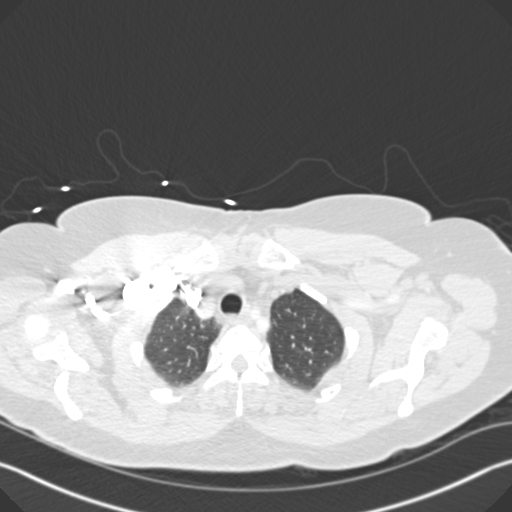
[im 228/255  mediastinal]
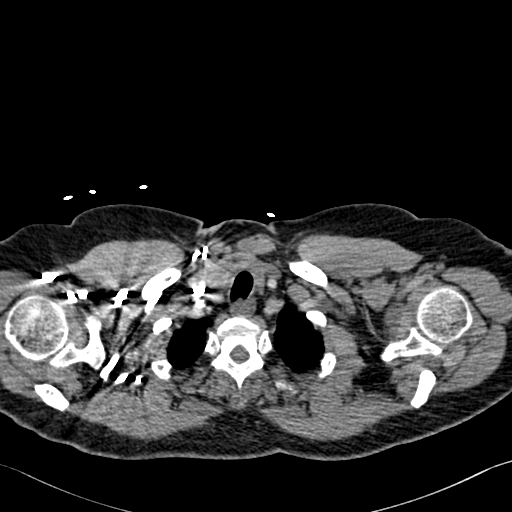
[im 241/255  lung]
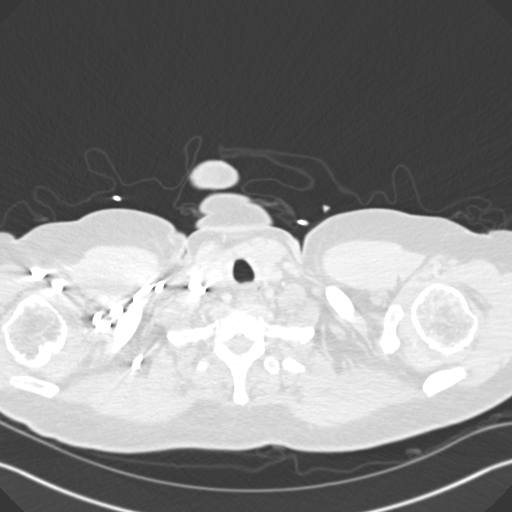

[Series 7: pe coronal mpr · coronal · 0.53mm/px · 1 of 131 slices shown]
[im 66/131  mediastinal]
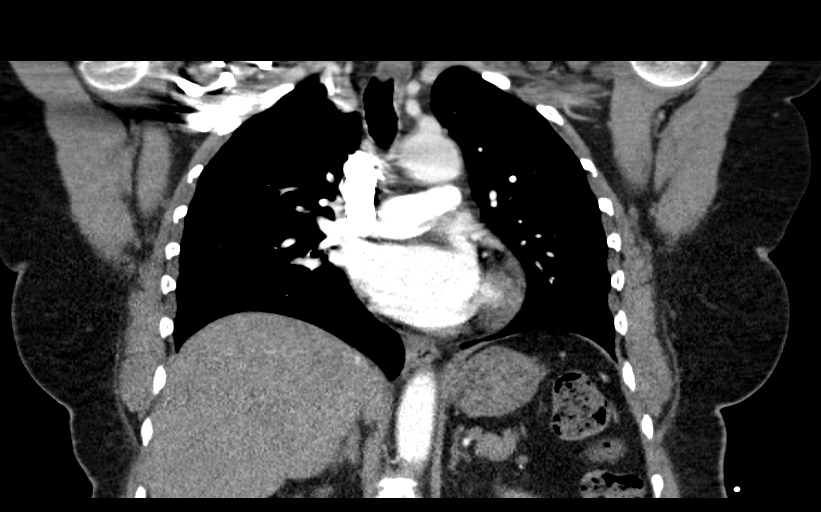

[18 of 36 positions shown; findings below may reference images not displayed]

FINDINGS: Cardiovascular: The study is high quality for the evaluation of
pulmonary embolism. Acute subsegmental pulmonary emboli in the
bilateral lower lobes (series 6/image 149 on the left and image 176
on the right). No saddle pulmonary emboli. No central, lobar or
segmental pulmonary emboli. Great vessels are normal in course and
caliber. Main pulmonary artery diameter 2.1 cm. Top-normal heart
size. No significant pericardial fluid/thickening. RV/LV ratio 0.88.

Mediastinum/Nodes: No discrete thyroid nodules. Unremarkable
esophagus. No pathologically enlarged axillary, mediastinal or hilar
lymph nodes.

Lungs/Pleura: No pneumothorax. No pleural effusion. No acute
consolidative airspace disease or lung masses. Subpleural 3 mm solid
medial right upper lobe pulmonary nodule (series 5/image 29). No
additional significant pulmonary nodules.

Upper abdomen: No acute abnormality.

Musculoskeletal: No aggressive appearing focal osseous lesions. Mild
thoracic spondylosis.

Review of the MIP images confirms the above findings.
IMPRESSION: 1. Acute subsegmental pulmonary embolism bilaterally in the lower
lobes, low clot burden. No evidence of right heart strain.
2. Solitary 3 mm subpleural medial right upper lobe pulmonary
nodule, probably benign. No follow-up needed if patient is low-risk.
Non-contrast chest CT can be considered in 12 months if patient is
high-risk. This recommendation follows the consensus statement:
Guidelines for Management of Incidental Pulmonary Nodules Detected
on CT Images:From the [HOSPITAL] 8952; published online
before print (10.1148/radiol.0504000063).

Critical Value/emergent results were called by telephone at the time
of interpretation on 06/26/2017 at [DATE] to Dr. ERVISTA CEOLA, who
verbally acknowledged these results.

## 2019-09-06 ENCOUNTER — Other Ambulatory Visit: Payer: Self-pay

## 2019-09-06 ENCOUNTER — Inpatient Hospital Stay: Payer: Medicare Other | Attending: Hematology & Oncology

## 2019-09-06 ENCOUNTER — Ambulatory Visit (HOSPITAL_BASED_OUTPATIENT_CLINIC_OR_DEPARTMENT_OTHER): Admission: RE | Admit: 2019-09-06 | Payer: Medicare Other | Source: Ambulatory Visit

## 2019-09-06 ENCOUNTER — Inpatient Hospital Stay: Payer: Medicare Other | Admitting: Hematology & Oncology

## 2019-11-01 ENCOUNTER — Other Ambulatory Visit: Payer: Self-pay | Admitting: Family

## 2019-11-01 DIAGNOSIS — R760 Raised antibody titer: Secondary | ICD-10-CM

## 2019-11-01 DIAGNOSIS — I2699 Other pulmonary embolism without acute cor pulmonale: Secondary | ICD-10-CM

## 2023-08-27 ENCOUNTER — Emergency Department (HOSPITAL_COMMUNITY)

## 2023-08-27 ENCOUNTER — Other Ambulatory Visit: Payer: Self-pay

## 2023-08-27 ENCOUNTER — Emergency Department (HOSPITAL_BASED_OUTPATIENT_CLINIC_OR_DEPARTMENT_OTHER)

## 2023-08-27 ENCOUNTER — Emergency Department (HOSPITAL_BASED_OUTPATIENT_CLINIC_OR_DEPARTMENT_OTHER)
Admission: EM | Admit: 2023-08-27 | Discharge: 2023-08-27 | Disposition: A | Discharge status: Home / Self Care | Attending: Emergency Medicine | Admitting: Emergency Medicine

## 2023-08-27 ENCOUNTER — Encounter (HOSPITAL_BASED_OUTPATIENT_CLINIC_OR_DEPARTMENT_OTHER): Payer: Self-pay | Admitting: Emergency Medicine

## 2023-08-27 DIAGNOSIS — M542 Cervicalgia: Secondary | ICD-10-CM | POA: Insufficient documentation

## 2023-08-27 DIAGNOSIS — R42 Dizziness and giddiness: Secondary | ICD-10-CM | POA: Diagnosis present

## 2023-08-27 DIAGNOSIS — H538 Other visual disturbances: Secondary | ICD-10-CM | POA: Insufficient documentation

## 2023-08-27 DIAGNOSIS — Z7901 Long term (current) use of anticoagulants: Secondary | ICD-10-CM | POA: Insufficient documentation

## 2023-08-27 DIAGNOSIS — R531 Weakness: Secondary | ICD-10-CM | POA: Insufficient documentation

## 2023-08-27 DIAGNOSIS — R29898 Other symptoms and signs involving the musculoskeletal system: Secondary | ICD-10-CM

## 2023-08-27 LAB — COMPREHENSIVE METABOLIC PANEL WITH GFR
ALT: 13 U/L (ref 0–44)
AST: 17 U/L (ref 15–41)
Albumin: 4 g/dL (ref 3.5–5.0)
Alkaline Phosphatase: 75 U/L (ref 38–126)
Anion gap: 11 (ref 5–15)
BUN: 24 mg/dL — ABNORMAL HIGH (ref 8–23)
CO2: 26 mmol/L (ref 22–32)
Calcium: 9.4 mg/dL (ref 8.9–10.3)
Chloride: 100 mmol/L (ref 98–111)
Creatinine, Ser: 1.23 mg/dL — ABNORMAL HIGH (ref 0.44–1.00)
GFR, Estimated: 46 mL/min — ABNORMAL LOW (ref >60)
Glucose, Bld: 159 mg/dL — ABNORMAL HIGH (ref 70–99)
Potassium: 3.6 mmol/L (ref 3.5–5.1)
Sodium: 137 mmol/L (ref 135–145)
Total Bilirubin: 0.4 mg/dL (ref 0.0–1.2)
Total Protein: 7 g/dL (ref 6.5–8.1)

## 2023-08-27 LAB — CBC
HCT: 38.7 % (ref 36.0–46.0)
Hemoglobin: 13.3 g/dL (ref 12.0–15.0)
MCH: 31 pg (ref 26.0–34.0)
MCHC: 34.4 g/dL (ref 30.0–36.0)
MCV: 90.2 fL (ref 80.0–100.0)
Platelets: 238 K/uL (ref 150–400)
RBC: 4.29 MIL/uL (ref 3.87–5.11)
RDW: 12.9 % (ref 11.5–15.5)
WBC: 3.4 K/uL — ABNORMAL LOW (ref 4.0–10.5)
nRBC: 0 % (ref 0.0–0.2)

## 2023-08-27 LAB — URINE DRUG SCREEN
Amphetamines: NOT DETECTED
Barbiturates: NOT DETECTED
Benzodiazepines: NOT DETECTED
Cocaine: NOT DETECTED
Fentanyl: NOT DETECTED
Methadone Scn, Ur: NOT DETECTED
Opiates: NOT DETECTED
Tetrahydrocannabinol: NOT DETECTED

## 2023-08-27 LAB — MAGNESIUM: Magnesium: 2.5 mg/dL — ABNORMAL HIGH (ref 1.7–2.4)

## 2023-08-27 LAB — URINALYSIS, ROUTINE W REFLEX MICROSCOPIC
Bilirubin Urine: NEGATIVE
Glucose, UA: NEGATIVE mg/dL
Hgb urine dipstick: NEGATIVE
Ketones, ur: NEGATIVE mg/dL
Leukocytes,Ua: NEGATIVE
Nitrite: NEGATIVE
Protein, ur: NEGATIVE mg/dL
Specific Gravity, Urine: 1.02 (ref 1.005–1.030)
pH: 5.5 (ref 5.0–8.0)

## 2023-08-27 LAB — PHOSPHORUS: Phosphorus: 3 mg/dL (ref 2.5–4.6)

## 2023-08-27 LAB — TSH: TSH: 1.02 u[IU]/mL (ref 0.350–4.500)

## 2023-08-27 LAB — CK: Total CK: 73 U/L (ref 38–234)

## 2023-08-27 LAB — LIPASE, BLOOD: Lipase: 33 U/L (ref 11–51)

## 2023-08-27 MED ORDER — GADOBUTROL 1 MMOL/ML IV SOLN
6.5000 mL | Freq: Once | INTRAVENOUS | Status: AC | PRN
  Administered 2023-08-27: 6.5 mL via INTRAVENOUS

## 2023-08-27 MED ORDER — ONDANSETRON HCL 4 MG/2ML IJ SOLN
4.0000 mg | Freq: Once | INTRAMUSCULAR | Status: AC | PRN
  Administered 2023-08-27: 4 mg via INTRAVENOUS
  Filled 2023-08-27: qty 2

## 2023-08-27 MED ORDER — LORAZEPAM 2 MG/ML IJ SOLN: 1.0000 mg | Freq: Once | INTRAMUSCULAR | Status: DC | PRN

## 2023-08-27 NOTE — ED Notes (Signed)
 Called lab for add ons, need to draw tubes for more.

## 2023-08-27 NOTE — ED Notes (Signed)
 Pt advised her quad muscles feel weak and her knees are going out. Hx of arthritis with injections q 6 months, but does not feel like knees are giving her any trouble. Feels like it's muscle fatigue when walking. No recent trauma. Utilized GLP-1 to lose 45lbs over last year. No constipation, diarrhea noted.

## 2023-08-27 NOTE — ED Provider Notes (Signed)
 Patient sent over for MRI brain and MRI cervical spine to rule out stroke for vertigo and numbness and tingling in the bilateral lower extremities that occurred today.  On my exam patient is neurovascularly intact.  NIH stroke scale 0.  No sensation or motor dysfunction.  MRI brain demonstrates no acute process.  MRI cervical spine demonstrate: 1. No acute intracranial abnormality or abnormal enhancement. 2. 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    Physical Exam  BP (!) 168/92   Pulse 71   Temp (!) 97.3 F (36.3 C) (Temporal)   Resp 16   Ht 5' 2 (1.575 m)   Wt 68 kg   SpO2 100%   BMI 27.44 kg/m   Physical Exam  Procedures  Procedures  ED Course / MDM    Medical Decision Making Amount and/or Complexity of Data Reviewed Labs: ordered. Radiology: ordered.  Risk Prescription drug management.   Patient stable for discharge with follow-up with neurosurgery on an outpatient basis.  Patient in no distress and overall condition improved here in the ED. Detailed discussions were had with the patient regarding current findings, and need for close f/u with PCP or on call doctor. The patient has been instructed to return immediately if the symptoms worsen in any way for re-evaluation. Patient verbalized understanding and is in agreement with current care plan. All questions answered prior to discharge.        Elnor Bernarda SQUIBB, DO 08/27/23 2003

## 2023-08-27 NOTE — ED Provider Notes (Signed)
 [facility] Provider Note   CSN: [id number] Arrival date & time: 08/27/23  1137     Patient presents with: Weakness and Emesis   Ashlee Riddle is a 73 y.o. female.   HPI Patient reports that since a week ago, she has had problems with feeling off balance and intermittently like her legs are giving out.  Patient reports that she feels like she is going to fall down and has to steady herself.  She reports a couple times she could just feel her legs getting weak and then giving out beneath her.  She did not collapse to the ground but was able to ease her self down without injury.  She has not been having headaches.  Patient endorses some slight blurring of vision.  No back pain.  Patient for some time she has some neck pain but has not noted it to be any different than what she considers some baseline neck pain.  No trauma or fall.  No visual loss.  No loss of control of bowels or bladder.  No chest pain, no shortness of breath, no abdominal pain.    Prior to Admission medications   Medication Sig Start Date End Date Taking? Authorizing Provider  furosemide (LASIX) 20 MG tablet TAKE 1 TABLET BY MOUTH AS NEEDED NEED APPOINTMENT FOR FURTHER REFILLS 10/29/17   [provider]  labetalol (NORMODYNE) 200 MG tablet Take 200 mg by mouth 2 (two) times daily.    [provider]  metFORMIN (GLUCOPHAGE) 500 MG tablet Take 500 mg by mouth 2 (two) times daily with a meal.    [provider]  oxyCODONE-acetaminophen  (PERCOCET/ROXICET) 5-325 MG tablet Take 1 tablet by mouth 2 (two) times daily as needed. 10/01/17   [provider]  simvastatin  (ZOCOR ) 20 MG tablet Take 20 mg by mouth every evening.    [provider]  XARELTO  10 MG TABS tablet TAKE 1 TABLET BY MOUTH ONCE DAILY WITH SUPPER 11/01/19   Timmy Maude SAUNDERS, MD    Allergies: Patient has no known allergies.    Review of Systems  Updated Vital Signs BP (!)  142/78   Pulse 73   Temp 98.1 F (36.7 C)   Resp 18   Ht 5' 2 (1.575 m)   Wt 68 kg   SpO2 100%   BMI 27.44 kg/m   Physical Exam Constitutional:      Comments: Well-nourished well-developed.  Alert nontoxic.  Clear mental status.  HENT:     Head: Normocephalic and atraumatic.     Right Ear: Tympanic membrane normal.     Left Ear: Tympanic membrane normal.     Mouth/Throat:     Mouth: Mucous membranes are moist.     Pharynx: Oropharynx is clear.  Eyes:     Extraocular Movements: Extraocular movements intact.     Pupils: Pupils are equal, round, and reactive to light.  Cardiovascular:     Rate and Rhythm: Normal rate and regular rhythm.  Pulmonary:     Effort: Pulmonary effort is normal.     Breath sounds: Normal breath sounds.  Abdominal:     General: There is no distension.     Palpations: Abdomen is soft.     Tenderness: There is no abdominal tenderness. There is no guarding.  Musculoskeletal:        General: No swelling or tenderness. Normal range of motion.     Cervical back: Neck supple.     Right lower  leg: No edema.     Left lower leg: No edema.  Skin:    General: Skin is warm and dry.  Neurological:     General: No focal deficit present.     Mental Status: She is oriented to person, place, and time.     Cranial Nerves: No cranial nerve deficit.     Sensory: No sensory deficit.     Motor: No weakness.     Coordination: Coordination normal.     Comments: Cranial nerves II through XII intact.  Finger-nose exam intact bilaterally.  Patient can elevate each lower extremity off of the bed and hold against resistance.  Psychiatric:        Mood and Affect: Mood normal.     (all labs ordered are listed, but only abnormal results are displayed) Labs Reviewed  COMPREHENSIVE METABOLIC PANEL WITH GFR - Abnormal; Notable for the following components:      Result Value   Glucose, Bld 159 (*)    BUN 24 (*)    Creatinine, Ser 1.23 (*)    GFR, Estimated 46 (*)    All  other components within normal limits  CBC - Abnormal; Notable for the following components:   WBC 3.4 (*)    All other components within normal limits  MAGNESIUM - Abnormal; Notable for the following components:   Magnesium 2.5 (*)    All other components within normal limits  LIPASE, BLOOD  URINALYSIS, ROUTINE W REFLEX MICROSCOPIC  PHOSPHORUS  CK  URINE DRUG SCREEN  TSH    EKG: EKG Interpretation Date/Time:  Friday August 27 2023 11:48:44 EDT Ventricular Rate:  85 PR Interval:  138 QRS Duration:  81 QT Interval:  373 QTC Calculation: 444 R Axis:   23  Text Interpretation: Sinus rhythm no change from previous, no acute ischemic appearance Confirmed by Armenta Canning [phone]) on 08/27/2023 12:03:23 PM  Radiology: CT Head Wo Contrast Result Date: 08/27/2023 EXAM: CT HEAD WITHOUT CONTRAST 08/27/2023 02:28:03 PM TECHNIQUE: CT of the head was performed without the administration of intravenous contrast. Automated exposure control, iterative reconstruction, and/or weight based adjustment of the mA/kV was utilized to reduce the radiation dose to as low as reasonably achievable. COMPARISON: 01/16/2016 CLINICAL HISTORY: Neuro deficit, acute, stroke suspected. FINDINGS: BRAIN AND VENTRICLES: No acute hemorrhage. Gray-white differentiation is preserved. No hydrocephalus. No extra-axial collection. No mass effect or midline shift. Nonspecific hypoattenuation in the periventricular and subcortical white matter, most likely representing chronic small vessel disease. Mild parenchymal volume loss. ORBITS: No acute abnormality. SINUSES: No acute abnormality. SOFT TISSUES AND SKULL: No acute soft tissue abnormality. No skull fracture. IMPRESSION: 1. No acute intracranial abnormality. 2. Nonspecific hypoattenuation in the periventricular and subcortical white matter, most likely representing chronic small vessel disease. 3. Mild parenchymal volume loss. Electronically signed by: Donnice Mania MD 08/27/2023  02:41 PM EDT RP Workstation: [id number]     Procedures   Medications Ordered in the ED  LORazepam  (ATIVAN ) injection 1 mg (has no administration in time range)  ondansetron  (ZOFRAN ) injection 4 mg (4 mg Intravenous Given 08/27/23 1208)                                    Medical Decision Making Amount and/or Complexity of Data Reviewed Labs: ordered. Radiology: ordered.  Risk Prescription drug management.   Patient presents with symptoms suggestive of both vertigo and possibly general weakness.  Patient describes a sensation of  being off balance for about a week and that this is completely new for her.  She describes sometimes using things to steady her balance.  She also describes falls but not in the sense of losing balance but in the sense of losing strength to support her weight.  This is bilateral lower extremities and not localizing, there is enough prodromal symptoms that the patient can manually lower herself down and is not collapsing.  However, she does not have back pain or bowel or bladder dysfunction.  Will proceed with broad evaluation for metabolic derangement\CVA\demyelinating disease\spinal stenosis.  CK 73 phosphorus 3.0 mag 2.5 glucose 159 potassium 3.6 GFR 46 anion gap normal lipase 33 white count 3.4 H&H normal UDS negative urinalysis negative  CT head interpreted by radiology no acute findings.  Nonspecific hypoattenuation periventricular and subcortical white matter, most likely representing chronic small vessel disease.  At this time with patient having symptoms of potentially central vertigo and loss of strength will proceed with MRI.  On exam patient is intact without focal neurodeficit at this time.  Symptoms onset has been approximately 7 days ago.  At this time patient does not meet code stroke criteria.  Other concern is for possible demyelinating disease or possibly cervical stenosis.  Patient does not have any neck pain or low back pain that is notable.  I  have low suspicion for a acute process.  Will plan for MRI and if negative, anticipate patient will be appropriate for discharge.       Final diagnoses:  Vertigo  Weakness of both lower extremities    ED Discharge Orders     None          Armenta Canning, MD 08/27/23 [phone]

## 2023-08-27 NOTE — ED Notes (Signed)
 Asked pt to prvide urine sample but declined stated will use bathroom shortly when she has to go.

## 2023-08-27 NOTE — ED Triage Notes (Signed)
 Pt BIB [facility] from [facility] d/t need for MRI. Pt was initially being seen for the last week having weakness in bil lower legs with dizziness. Neuro screening & CT was negative, orthostatics were + & sent here to rule out central vertigo with weakness (per [facility]). 175/65, 74 bpm NSR, 97% on RA, A/Ox4, 20g Rt FA. [facility] reports her legs are both weak but do have = strengths.

## 2023-08-27 NOTE — ED Notes (Addendum)
 Pt given sip of water per request. Nausea improved.

## 2023-08-27 NOTE — Discharge Instructions (Addendum)
 1.  Follow-up with your doctor within the next 2 to 4 days. 2.  Continue your regularly prescribed medications. 3.  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  Please follow-up with your primary care physician and/or the neurosurgery team if symptoms worsen in any way.

## 2023-08-27 NOTE — ED Triage Notes (Signed)
 Pt ambulatory to pt room- c/o nausea, BLLE weakness since yesterday.  Feels like legs are going to give out.  Emesis x2 last night.  Denies fever, known sick contacts, diarrhea.   Good po intake.

## 2023-08-27 NOTE — ED Notes (Signed)
 Patient transported to MRI
